# Patient Record
Sex: Male | Born: 1970
Health system: Southern US, Community
[De-identification: ages and names within clinical notes are randomized; demographics above are authoritative.]

## PROBLEM LIST (undated history)

## (undated) DIAGNOSIS — K7460 Unspecified cirrhosis of liver: Secondary | ICD-10-CM

## (undated) DIAGNOSIS — F419 Anxiety disorder, unspecified: Secondary | ICD-10-CM

## (undated) DIAGNOSIS — R188 Other ascites: Secondary | ICD-10-CM

## (undated) HISTORY — DX: Unspecified cirrhosis of liver: K74.60

## (undated) HISTORY — DX: Other ascites: R18.8

## (undated) HISTORY — PX: NO PAST SURGERIES: SHX2092

## (undated) HISTORY — PX: PARACENTESIS: SHX844

## (undated) HISTORY — PX: SMALL INTESTINE SURGERY: SHX150

---

## 2014-07-13 ENCOUNTER — Emergency Department (HOSPITAL_COMMUNITY): Payer: Self-pay

## 2014-07-13 ENCOUNTER — Encounter (HOSPITAL_COMMUNITY): Payer: Self-pay

## 2014-07-13 ENCOUNTER — Emergency Department (HOSPITAL_COMMUNITY)
Admission: EM | Admit: 2014-07-13 | Discharge: 2014-07-14 | Disposition: A | Discharge status: Home / Self Care | Payer: Self-pay | Attending: Emergency Medicine | Admitting: Emergency Medicine

## 2014-07-13 DIAGNOSIS — S92301A Fracture of unspecified metatarsal bone(s), right foot, initial encounter for closed fracture: Secondary | ICD-10-CM

## 2014-07-13 DIAGNOSIS — S80211A Abrasion, right knee, initial encounter: Secondary | ICD-10-CM | POA: Insufficient documentation

## 2014-07-13 DIAGNOSIS — Y9355 Activity, bike riding: Secondary | ICD-10-CM | POA: Insufficient documentation

## 2014-07-13 DIAGNOSIS — Y998 Other external cause status: Secondary | ICD-10-CM | POA: Insufficient documentation

## 2014-07-13 DIAGNOSIS — S93324A Dislocation of tarsometatarsal joint of right foot, initial encounter: Secondary | ICD-10-CM | POA: Insufficient documentation

## 2014-07-13 DIAGNOSIS — T07XXXA Unspecified multiple injuries, initial encounter: Secondary | ICD-10-CM

## 2014-07-13 DIAGNOSIS — S90512A Abrasion, left ankle, initial encounter: Secondary | ICD-10-CM | POA: Insufficient documentation

## 2014-07-13 DIAGNOSIS — Y92410 Unspecified street and highway as the place of occurrence of the external cause: Secondary | ICD-10-CM | POA: Insufficient documentation

## 2014-07-13 DIAGNOSIS — S52592A Other fractures of lower end of left radius, initial encounter for closed fracture: Secondary | ICD-10-CM | POA: Insufficient documentation

## 2014-07-13 DIAGNOSIS — S62102A Fracture of unspecified carpal bone, left wrist, initial encounter for closed fracture: Secondary | ICD-10-CM

## 2014-07-13 DIAGNOSIS — S92901A Unspecified fracture of right foot, initial encounter for closed fracture: Secondary | ICD-10-CM

## 2014-07-13 DIAGNOSIS — S93334A Other dislocation of right foot, initial encounter: Secondary | ICD-10-CM

## 2014-07-13 DIAGNOSIS — S92331A Displaced fracture of third metatarsal bone, right foot, initial encounter for closed fracture: Secondary | ICD-10-CM | POA: Insufficient documentation

## 2014-07-13 DIAGNOSIS — Z720 Tobacco use: Secondary | ICD-10-CM | POA: Insufficient documentation

## 2014-07-13 DIAGNOSIS — S80212A Abrasion, left knee, initial encounter: Secondary | ICD-10-CM | POA: Insufficient documentation

## 2014-07-13 MED ORDER — ONDANSETRON HCL 4 MG/2ML IJ SOLN
4.0000 mg | Freq: Once | INTRAMUSCULAR | Status: AC
  Administered 2014-07-13: 4 mg via INTRAVENOUS
  Filled 2014-07-13: qty 2

## 2014-07-13 MED ORDER — PROPOFOL 10 MG/ML IV BOLUS
INTRAVENOUS | Status: AC | PRN
  Administered 2014-07-13: 20 mg via INTRAVENOUS

## 2014-07-13 MED ORDER — PROPOFOL 10 MG/ML IV BOLUS
INTRAVENOUS | Status: AC | PRN
  Administered 2014-07-13: 40 mg via INTRAVENOUS

## 2014-07-13 MED ORDER — PROPOFOL 10 MG/ML IV BOLUS
INTRAVENOUS | Status: AC
  Filled 2014-07-13: qty 20

## 2014-07-13 MED ORDER — CEFAZOLIN SODIUM-DEXTROSE 2-3 GM-% IV SOLR
2.0000 g | Freq: Once | INTRAVENOUS | Status: AC
  Administered 2014-07-13: 2 g via INTRAVENOUS
  Filled 2014-07-13: qty 50

## 2014-07-13 MED ORDER — HYDROMORPHONE HCL 1 MG/ML IJ SOLN
1.0000 mg | Freq: Once | INTRAMUSCULAR | Status: AC
  Administered 2014-07-13: 1 mg via INTRAVENOUS
  Filled 2014-07-13: qty 1

## 2014-07-13 MED ORDER — PROPOFOL 10 MG/ML IV BOLUS
INTRAVENOUS | Status: AC | PRN
  Administered 2014-07-13: 50 mg via INTRAVENOUS

## 2014-07-13 MED ORDER — HYDROMORPHONE HCL 1 MG/ML IJ SOLN
1.0000 mg | Freq: Once | INTRAMUSCULAR | Status: AC
  Administered 2014-07-13: 0.6 mg via INTRAVENOUS
  Filled 2014-07-13: qty 1

## 2014-07-13 MED ORDER — PROPOFOL 10 MG/ML IV BOLUS
40.0000 mg | Freq: Once | INTRAVENOUS | Status: DC
  Filled 2014-07-13: qty 20

## 2014-07-13 MED ORDER — SODIUM CHLORIDE 0.9 % IV SOLN
INTRAVENOUS | Status: DC
  Administered 2014-07-13: 22:00:00 via INTRAVENOUS

## 2014-07-13 MED ORDER — TETANUS-DIPHTHERIA TOXOIDS TD 5-2 LFU IM INJ
0.5000 mL | INJECTION | Freq: Once | INTRAMUSCULAR | Status: AC
  Administered 2014-07-13: 0.5 mL via INTRAMUSCULAR
  Filled 2014-07-13: qty 0.5

## 2014-07-13 NOTE — Progress Notes (Signed)
Orthopedic Tech Progress Note Patient Details:  Mc Fecko March 14, 1970 [id number] Ordered by Dr. August Saucer orthopedic doctor. Patient ID: Landis Varon, male   DOB: March 28, 1970, 44 y.o.   MRN: [id number]   [name] 07/13/2014, 11:57 PM

## 2014-07-13 NOTE — ED Notes (Signed)
Pulses present in left wrist and right dorsalis pedis.
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
## 2014-07-13 NOTE — Progress Notes (Signed)
Orthopedic Tech Progress Note Patient Details:  Michael [name] Jun 25, 1970 [id number]  Ortho Devices Type of Ortho Device: Ace wrap, Volar splint, Post (short leg) splint, Stirrup splint Ortho Device/Splint Location: RLE short leg splint, LUE volar splint Ortho Device/Splint Interventions: Ordered, Application   [name] 07/13/2014, 11:56 PM

## 2014-07-13 NOTE — Progress Notes (Signed)
Orthopedic Tech Progress Note Patient Details:  Michael [name] 1970-06-14 [id number]  Ortho Devices Type of Ortho Device: Ace wrap, Volar splint, Post (short leg) splint, Stirrup splint Ortho Device/Splint Location: RLE short leg splint, LUE volar splint Ortho Device/Splint Interventions: Ordered, Application   [name] 07/13/2014, 11:57 PM
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
## 2014-07-13 NOTE — Progress Notes (Signed)
Orthopedic Tech Progress Note Patient Details:  Michael [name] [birth year] [id number]  Ortho Devices Type of Ortho Device: Ace wrap, Volar splint, Post (short leg) splint, Stirrup splint Ortho Device/Splint Location: RLE short leg splint, LUE volar splint Ortho Device/Splint Interventions: Ordered, Application   [name] 07/13/2014, 11:55 PM

## 2014-07-14 ENCOUNTER — Encounter (HOSPITAL_COMMUNITY): Payer: Self-pay

## 2014-07-14 ENCOUNTER — Emergency Department (HOSPITAL_COMMUNITY): Payer: Self-pay

## 2014-07-14 MED ORDER — IBUPROFEN 800 MG PO TABS: 800.0000 mg | ORAL_TABLET | Freq: Three times a day (TID) | ORAL | Status: DC

## 2014-07-14 MED ORDER — BACITRACIN ZINC 500 UNIT/GM EX OINT: 1.0000 "application " | TOPICAL_OINTMENT | Freq: Two times a day (BID) | CUTANEOUS | Status: DC

## 2014-07-14 MED ORDER — OXYCODONE-ACETAMINOPHEN 5-325 MG PO TABS: 1.0000 | ORAL_TABLET | ORAL | Status: DC | PRN

## 2014-07-14 MED ORDER — OXYCODONE-ACETAMINOPHEN 5-325 MG PO TABS
2.0000 | ORAL_TABLET | Freq: Once | ORAL | Status: AC
  Administered 2014-07-14: 2 via ORAL
  Filled 2014-07-14: qty 2

## 2014-07-14 NOTE — Discharge Instructions (Signed)
Non weight bearing through right foot and left wrist Elevate foot and wrist  Abrasion An abrasion is a cut or scrape of the skin. Abrasions do not extend through all layers of the skin and most heal within 10 days. It is important to care for your abrasion properly to prevent infection. CAUSES  Most abrasions are caused by falling on, or gliding across, the ground or other surface. When your skin rubs on something, the outer and inner layer of skin rubs off, causing an abrasion. DIAGNOSIS  Your caregiver will be able to diagnose an abrasion during a physical exam.  TREATMENT  Your treatment depends on how large and deep the abrasion is. Generally, your abrasion will be cleaned with water and a mild soap to remove any dirt or debris. An antibiotic ointment may be put over the abrasion to prevent an infection. A bandage (dressing) may be wrapped around the abrasion to keep it from getting dirty.  You may need a tetanus shot if:  You cannot remember when you had your last tetanus shot.  You have never had a tetanus shot.  The injury broke your skin. If you get a tetanus shot, your arm may swell, get red, and feel warm to the touch. This is common and not a problem. If you need a tetanus shot and you choose not to have one, there is a rare chance of getting tetanus. Sickness from tetanus can be serious.  HOME CARE INSTRUCTIONS   If a dressing was applied, change it at least once a day or as directed by your caregiver. If the bandage sticks, soak it off with warm water.   Wash the area with water and a mild soap to remove all the ointment 2 times a day. Rinse off the soap and pat the area dry with a clean towel.   Reapply any ointment as directed by your caregiver. This will help prevent infection and keep the bandage from sticking. Use gauze over the wound and under the dressing to help keep the bandage from sticking.   Change your dressing right away if it becomes wet or dirty.   Only  take over-the-counter or prescription medicines for pain, discomfort, or fever as directed by your caregiver.   Follow up with your caregiver within 24-48 hours for a wound check, or as directed. If you were not given a wound-check appointment, look closely at your abrasion for redness, swelling, or pus. These are signs of infection. SEEK IMMEDIATE MEDICAL CARE IF:   You have increasing pain in the wound.   You have redness, swelling, or tenderness around the wound.   You have pus coming from the wound.   You have a fever or persistent symptoms for more than 2-3 days.  You have a fever and your symptoms suddenly get worse.  You have a bad smell coming from the wound or dressing.  MAKE SURE YOU:   Understand these instructions.  Will watch your condition.  Will get help right away if you are not doing well or get worse. Document Released: 10/25/2004 Document Revised: 01/02/2012 Document Reviewed: 12/19/2010 [facility] Patient Information 2015 [place], [place]. This information is not intended to replace advice given to you by your health care provider. Make sure you discuss any questions you have with your health care provider. Metatarsal Fracture A metatarsal fracture is a break in the bone(s) of the foot. These are the bones of the foot that connect your toes to the bones of the ankle. DIAGNOSIS  The diagnoses of these fractures are usually made with X-rays. If there are problems in the forefoot and x-rays are normal a later bone scan will usually make the diagnosis.  TREATMENT AND HOME CARE INSTRUCTIONS  Treatment may or may not include a cast or walking shoe. When casts are needed the use is usually for short periods of time so as not to slow down healing with muscle wasting (atrophy).  Activities should be stopped until further advised by your caregiver.  Wear shoes with adequate shock absorbing capabilities and stiff soles.  Alternative exercise may be undertaken while  waiting for healing. These may include bicycling and swimming, or as your caregiver suggests.  It is important to keep all follow-up visits or specialty referrals. The failure to keep these appointments could result in improper bone healing and chronic pain or disability.  Warning: Do not drive a car or operate a motor vehicle until your caregiver specifically tells you it is safe to do so. IF YOU DO NOT HAVE A CAST OR SPLINT:  Apply ice to the injury for 15-20 minutes each hour while awake for the first 2 days. Put the ice in a plastic bag and place a towel between the bag of ice and your skin.  Only take over-the-counter or prescription medicines for pain, discomfort, or fever as directed by your caregiver. SEEK IMMEDIATE MEDICAL CARE IF:   Your cast gets damaged or breaks.  You have continued severe pain or more swelling than you did before the cast was put on, or the pain is not controlled with medications.  Your skin or nails below the injury turn blue or grey, or feel cold or numb.  There is a bad smell, or new stains or pus-like (purulent) drainage coming from the cast. MAKE SURE YOU:   Understand these instructions.  Will watch your condition.  Will get help right away if you are not doing well or get worse. Document Released: 10/07/2001 Document Revised: 04/09/2011 Document Reviewed: 08/29/2007 [facility] Patient Information 2015 [place], [place]. This information is not intended to replace advice given to you by your health care provider. Make sure you discuss any questions you have with your health care provider. Wrist Fracture A wrist fracture is a break or crack in one of the bones of your wrist. Your wrist is made up of eight small bones at the palm of your hand (carpal bones) and two long bones that make up your forearm (radius and ulna).  CAUSES   A direct blow to the wrist.  Falling on an outstretched hand.  Trauma, such as a car accident or a fall. RISK  FACTORS Risk factors for wrist fracture include:   Participating in contact and high-risk sports, such as skiing, biking, and ice skating.  Taking steroid medicines.  Smoking.  Being male.  Being Caucasian.  Drinking more than three alcoholic beverages per day.  Having low or lowered bone density (osteoporosis or osteopenia).  Age. Older adults have decreased bone density.  Women who have had menopause.  History of previous fractures. SIGNS AND SYMPTOMS Symptoms of wrist fractures include tenderness, bruising, and inflammation. Additionally, the wrist may hang in an odd position or appear deformed.  DIAGNOSIS Diagnosis may include:  Physical exam.  X-ray. TREATMENT Treatment depends on many factors, including the nature and location of the fracture, your age, and your activity level. Treatment for wrist fracture can be nonsurgical or surgical.  Nonsurgical Treatment A plaster cast or splint may be applied to your  wrist if the bone is in a good position. If the fracture is not in good position, it may be necessary for your health care provider to realign it before applying a splint or cast. Usually, a cast or splint will be worn for several weeks.  Surgical Treatment Sometimes the position of the bone is so far out of place that surgery is required to apply a device to hold it together as it heals. Depending on the fracture, there are a number of options for holding the bone in place while it heals, such as a cast and metal pins.  HOME CARE INSTRUCTIONS  Keep your injured wrist elevated and move your fingers as much as possible.  Do not put pressure on any part of your cast or splint. It may break.   Use a plastic bag to protect your cast or splint from water while bathing or showering. Do not lower your cast or splint into water.  Take medicines only as directed by your health care provider.  Keep your cast or splint clean and dry. If it becomes wet, damaged, or  suddenly feels too tight, contact your health care provider right away.  Do not use any tobacco products including cigarettes, chewing tobacco, or electronic cigarettes. Tobacco can delay bone healing. If you need help quitting, ask your health care provider.  Keep all follow-up visits as directed by your health care provider. This is important.  Ask your health care provider if you should take supplements of calcium and vitamins C and D to promote bone healing. SEEK MEDICAL CARE IF:   Your cast or splint is damaged, breaks, or gets wet.  You have a fever.  You have chills.  You have continued severe pain or more swelling than you did before the cast was put on. SEEK IMMEDIATE MEDICAL CARE IF:   Your hand or fingernails on the injured arm turn blue or gray, or feel cold or numb.  You have decreased feeling in the fingers of your injured arm. MAKE SURE YOU:  Understand these instructions.  Will watch your condition.  Will get help right away if you are not doing well or get worse. Document Released: 10/25/2004 Document Revised: 06/01/2013 Document Reviewed: 02/02/2011 [facility] Patient Information 2015 [place], [place]. This information is not intended to replace advice given to you by your health care provider. Make sure you discuss any questions you have with your health care provider.
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
## 2014-07-19 ENCOUNTER — Other Ambulatory Visit (HOSPITAL_COMMUNITY): Payer: Self-pay | Admitting: Orthopedic Surgery

## 2014-07-20 ENCOUNTER — Ambulatory Visit (HOSPITAL_COMMUNITY): Payer: Self-pay | Admitting: Certified Registered Nurse Anesthetist

## 2014-07-20 ENCOUNTER — Ambulatory Visit (HOSPITAL_COMMUNITY)
Admission: RE | Admit: 2014-07-20 | Discharge: 2014-07-20 | Disposition: A | Discharge status: Home / Self Care | Payer: Self-pay | Source: Ambulatory Visit | Attending: Orthopedic Surgery | Admitting: Orthopedic Surgery

## 2014-07-20 ENCOUNTER — Ambulatory Visit (HOSPITAL_COMMUNITY): Payer: Self-pay

## 2014-07-20 ENCOUNTER — Encounter (HOSPITAL_COMMUNITY)
Admission: RE | Disposition: A | Discharge status: Home / Self Care | Payer: Self-pay | Source: Ambulatory Visit | Attending: Orthopedic Surgery

## 2014-07-20 ENCOUNTER — Encounter (HOSPITAL_COMMUNITY): Payer: Self-pay | Admitting: *Deleted

## 2014-07-20 DIAGNOSIS — F17210 Nicotine dependence, cigarettes, uncomplicated: Secondary | ICD-10-CM | POA: Insufficient documentation

## 2014-07-20 DIAGNOSIS — Z419 Encounter for procedure for purposes other than remedying health state, unspecified: Secondary | ICD-10-CM

## 2014-07-20 DIAGNOSIS — S52572A Other intraarticular fracture of lower end of left radius, initial encounter for closed fracture: Secondary | ICD-10-CM | POA: Insufficient documentation

## 2014-07-20 HISTORY — DX: Anxiety disorder, unspecified: F41.9

## 2014-07-20 HISTORY — PX: ORIF WRIST FRACTURE: SHX2133

## 2014-07-20 LAB — CBC
HEMATOCRIT: 41 % (ref 39.0–52.0)
Hemoglobin: 14 g/dL (ref 13.0–17.0)
MCH: 34.1 pg — AB (ref 26.0–34.0)
MCHC: 34.1 g/dL (ref 30.0–36.0)
MCV: 99.8 fL (ref 78.0–100.0)
Platelets: 244 10*3/uL (ref 150–400)
RBC: 4.11 MIL/uL — ABNORMAL LOW (ref 4.22–5.81)
RDW: 13.6 % (ref 11.5–15.5)
WBC: 6.9 10*3/uL (ref 4.0–10.5)

## 2014-07-20 SURGERY — OPEN REDUCTION INTERNAL FIXATION (ORIF) WRIST FRACTURE
Anesthesia: General | Site: Wrist | Laterality: Left

## 2014-07-20 MED ORDER — ONDANSETRON HCL 4 MG/2ML IJ SOLN
INTRAMUSCULAR | Status: DC | PRN
Start: 2014-07-20 — End: 2014-07-20
  Administered 2014-07-20: 4 mg via INTRAVENOUS

## 2014-07-20 MED ORDER — LIDOCAINE HCL (CARDIAC) 20 MG/ML IV SOLN
INTRAVENOUS | Status: AC
  Filled 2014-07-20: qty 10

## 2014-07-20 MED ORDER — OXYCODONE HCL 5 MG PO TABS
ORAL_TABLET | ORAL | Status: AC
  Filled 2014-07-20: qty 1

## 2014-07-20 MED ORDER — ONDANSETRON HCL 4 MG/2ML IJ SOLN
INTRAMUSCULAR | Status: AC
  Filled 2014-07-20: qty 2

## 2014-07-20 MED ORDER — HYDROMORPHONE HCL 1 MG/ML IJ SOLN
INTRAMUSCULAR | Status: AC
  Filled 2014-07-20: qty 1

## 2014-07-20 MED ORDER — OXYCODONE HCL 5 MG PO TABS: 5.0000 mg | ORAL_TABLET | Freq: Once | ORAL | Status: DC | PRN

## 2014-07-20 MED ORDER — OXYCODONE HCL 5 MG/5ML PO SOLN: 5.0000 mg | Freq: Once | ORAL | Status: AC | PRN

## 2014-07-20 MED ORDER — LIDOCAINE HCL 4 % MT SOLN
OROMUCOSAL | Status: DC | PRN
  Administered 2014-07-20: 4 mL via TOPICAL

## 2014-07-20 MED ORDER — MIDAZOLAM HCL 2 MG/2ML IJ SOLN
INTRAMUSCULAR | Status: AC
  Filled 2014-07-20: qty 2

## 2014-07-20 MED ORDER — MEPERIDINE HCL 25 MG/ML IJ SOLN: 6.2500 mg | INTRAMUSCULAR | Status: DC | PRN

## 2014-07-20 MED ORDER — BUPIVACAINE HCL (PF) 0.25 % IJ SOLN
INTRAMUSCULAR | Status: AC
  Filled 2014-07-20: qty 30

## 2014-07-20 MED ORDER — LIDOCAINE HCL (CARDIAC) 20 MG/ML IV SOLN
INTRAVENOUS | Status: DC | PRN
  Administered 2014-07-20: 100 mg via INTRAVENOUS

## 2014-07-20 MED ORDER — HYDROMORPHONE HCL 1 MG/ML IJ SOLN: 0.2500 mg | INTRAMUSCULAR | Status: DC | PRN

## 2014-07-20 MED ORDER — ROCURONIUM BROMIDE 50 MG/5ML IV SOLN
INTRAVENOUS | Status: AC
  Filled 2014-07-20: qty 1

## 2014-07-20 MED ORDER — LIDOCAINE HCL (CARDIAC) 20 MG/ML IV SOLN
INTRAVENOUS | Status: AC
  Filled 2014-07-20: qty 5

## 2014-07-20 MED ORDER — PROPOFOL 10 MG/ML IV BOLUS
INTRAVENOUS | Status: DC | PRN
  Administered 2014-07-20: 200 mg via INTRAVENOUS

## 2014-07-20 MED ORDER — EPINEPHRINE HCL 1 MG/ML IJ SOLN
INTRAMUSCULAR | Status: DC | PRN
  Administered 2014-07-20: 1 mg

## 2014-07-20 MED ORDER — ARTIFICIAL TEARS OP OINT
TOPICAL_OINTMENT | OPHTHALMIC | Status: AC
  Filled 2014-07-20: qty 3.5

## 2014-07-20 MED ORDER — FENTANYL CITRATE (PF) 250 MCG/5ML IJ SOLN
INTRAMUSCULAR | Status: AC
  Filled 2014-07-20: qty 5

## 2014-07-20 MED ORDER — EPHEDRINE SULFATE 50 MG/ML IJ SOLN
INTRAMUSCULAR | Status: DC | PRN
  Administered 2014-07-20 (×2): 10 mg via INTRAVENOUS

## 2014-07-20 MED ORDER — EPINEPHRINE HCL 1 MG/ML IJ SOLN
INTRAMUSCULAR | Status: AC
  Filled 2014-07-20: qty 1

## 2014-07-20 MED ORDER — OXYCODONE HCL 5 MG PO TABS
5.0000 mg | ORAL_TABLET | Freq: Once | ORAL | Status: AC | PRN
  Administered 2014-07-20: 5 mg via ORAL

## 2014-07-20 MED ORDER — CEFAZOLIN SODIUM-DEXTROSE 2-3 GM-% IV SOLR
INTRAVENOUS | Status: AC
  Administered 2014-07-20: 2 g via INTRAVENOUS
  Filled 2014-07-20: qty 50

## 2014-07-20 MED ORDER — 0.9 % SODIUM CHLORIDE (POUR BTL) OPTIME
TOPICAL | Status: DC | PRN
  Administered 2014-07-20 (×2): 1000 mL

## 2014-07-20 MED ORDER — NEOSTIGMINE METHYLSULFATE 10 MG/10ML IV SOLN
INTRAVENOUS | Status: AC
  Filled 2014-07-20: qty 1

## 2014-07-20 MED ORDER — PROPOFOL 10 MG/ML IV BOLUS
INTRAVENOUS | Status: AC
  Filled 2014-07-20: qty 20

## 2014-07-20 MED ORDER — ARTIFICIAL TEARS OP OINT
TOPICAL_OINTMENT | OPHTHALMIC | Status: DC | PRN
  Administered 2014-07-20: 1 via OPHTHALMIC

## 2014-07-20 MED ORDER — SUCCINYLCHOLINE CHLORIDE 20 MG/ML IJ SOLN
INTRAMUSCULAR | Status: DC | PRN
  Administered 2014-07-20: 100 mg via INTRAVENOUS

## 2014-07-20 MED ORDER — OXYCODONE HCL 5 MG/5ML PO SOLN: 5.0000 mg | Freq: Once | ORAL | Status: DC | PRN

## 2014-07-20 MED ORDER — FENTANYL CITRATE (PF) 100 MCG/2ML IJ SOLN
INTRAMUSCULAR | Status: DC | PRN
  Administered 2014-07-20 (×2): 50 ug via INTRAVENOUS
  Administered 2014-07-20: 100 ug via INTRAVENOUS
  Administered 2014-07-20: 50 ug via INTRAVENOUS

## 2014-07-20 MED ORDER — HYDROMORPHONE HCL 1 MG/ML IJ SOLN
0.2500 mg | INTRAMUSCULAR | Status: DC | PRN
  Administered 2014-07-20 (×3): 0.5 mg via INTRAVENOUS

## 2014-07-20 MED ORDER — BUPIVACAINE HCL (PF) 0.25 % IJ SOLN
INTRAMUSCULAR | Status: DC | PRN
  Administered 2014-07-20: 15 mL

## 2014-07-20 MED ORDER — GLYCOPYRROLATE 0.2 MG/ML IJ SOLN
INTRAMUSCULAR | Status: AC
  Filled 2014-07-20: qty 3

## 2014-07-20 MED ORDER — MIDAZOLAM HCL 5 MG/5ML IJ SOLN
INTRAMUSCULAR | Status: DC | PRN
  Administered 2014-07-20: 2 mg via INTRAVENOUS

## 2014-07-20 MED ORDER — LACTATED RINGERS IV SOLN
INTRAVENOUS | Status: DC
  Administered 2014-07-20: 50 mL/h via INTRAVENOUS

## 2014-07-20 SURGICAL SUPPLY — 80 items
BANDAGE ELASTIC 4 VELCRO ST LF (GAUZE/BANDAGES/DRESSINGS) ×3 IMPLANT
BENZOIN TINCTURE PRP APPL 2/3 (GAUZE/BANDAGES/DRESSINGS) ×3 IMPLANT
BIT DRILL 2.2 SS TIBIAL (BIT) ×3 IMPLANT
BLADE SURG 10 STRL SS (BLADE) ×3 IMPLANT
BNDG COHESIVE 4X5 TAN STRL (GAUZE/BANDAGES/DRESSINGS) ×3 IMPLANT
BNDG ESMARK 4X9 LF (GAUZE/BANDAGES/DRESSINGS) ×3 IMPLANT
BNDG GAUZE ELAST 4 BULKY (GAUZE/BANDAGES/DRESSINGS) IMPLANT
CLOSURE WOUND 1/2 X4 (GAUZE/BANDAGES/DRESSINGS) ×1
CORDS BIPOLAR (ELECTRODE) ×3 IMPLANT
COVER SURGICAL LIGHT HANDLE (MISCELLANEOUS) ×3 IMPLANT
CUFF TOURNIQUET SINGLE 18IN (TOURNIQUET CUFF) ×3 IMPLANT
CUFF TOURNIQUET SINGLE 24IN (TOURNIQUET CUFF) IMPLANT
DRAIN TLS ROUND 10FR (DRAIN) IMPLANT
DRAPE C-ARM 42X72 X-RAY (DRAPES) ×3 IMPLANT
DRAPE INCISE IOBAN 66X45 STRL (DRAPES) ×3 IMPLANT
DRAPE OEC MINIVIEW 54X84 (DRAPES) IMPLANT
DRAPE U-SHAPE 47X51 STRL (DRAPES) ×3 IMPLANT
DRSG PAD ABDOMINAL 8X10 ST (GAUZE/BANDAGES/DRESSINGS) ×3 IMPLANT
DRSG TEGADERM 2-3/8X2-3/4 SM (GAUZE/BANDAGES/DRESSINGS) ×3 IMPLANT
DURAPREP 26ML APPLICATOR (WOUND CARE) ×3 IMPLANT
ELECT REM PT RETURN 9FT ADLT (ELECTROSURGICAL) ×3
ELECTRODE REM PT RTRN 9FT ADLT (ELECTROSURGICAL) ×1 IMPLANT
FACESHIELD WRAPAROUND (MASK) ×3 IMPLANT
GAUZE SPONGE 4X4 12PLY STRL (GAUZE/BANDAGES/DRESSINGS) IMPLANT
GAUZE XEROFORM 1X8 LF (GAUZE/BANDAGES/DRESSINGS) IMPLANT
GLOVE BIO SURGEON ST LM GN SZ9 (GLOVE) ×3 IMPLANT
GLOVE BIOGEL PI IND STRL 6.5 (GLOVE) ×2 IMPLANT
GLOVE BIOGEL PI IND STRL 7.0 (GLOVE) ×1 IMPLANT
GLOVE BIOGEL PI IND STRL 8 (GLOVE) ×1 IMPLANT
GLOVE BIOGEL PI INDICATOR 6.5 (GLOVE) ×4
GLOVE BIOGEL PI INDICATOR 7.0 (GLOVE) ×2
GLOVE BIOGEL PI INDICATOR 8 (GLOVE) ×2
GLOVE ECLIPSE 6.5 STRL STRAW (GLOVE) ×3 IMPLANT
GLOVE SURG ORTHO 8.0 STRL STRW (GLOVE) ×3 IMPLANT
GLOVE SURG SS PI 6.5 STRL IVOR (GLOVE) ×3 IMPLANT
GLOVE SURG SS PI 7.0 STRL IVOR (GLOVE) ×3 IMPLANT
GOWN STRL REUS W/ TWL LRG LVL3 (GOWN DISPOSABLE) ×3 IMPLANT
GOWN STRL REUS W/ TWL XL LVL3 (GOWN DISPOSABLE) ×1 IMPLANT
GOWN STRL REUS W/TWL LRG LVL3 (GOWN DISPOSABLE) ×6
GOWN STRL REUS W/TWL XL LVL3 (GOWN DISPOSABLE) ×2
K-WIRE 1.6 (WIRE) ×4
K-WIRE FX5X1.6XNS BN SS (WIRE) ×2
KIT BASIN OR (CUSTOM PROCEDURE TRAY) ×3 IMPLANT
KIT ROOM TURNOVER OR (KITS) ×3 IMPLANT
KWIRE FX5X1.6XNS BN SS (WIRE) ×2 IMPLANT
MANIFOLD NEPTUNE II (INSTRUMENTS) ×3 IMPLANT
NEEDLE 22X1 1/2 (OR ONLY) (NEEDLE) ×3 IMPLANT
NS IRRIG 1000ML POUR BTL (IV SOLUTION) ×3 IMPLANT
PACK ORTHO EXTREMITY (CUSTOM PROCEDURE TRAY) ×3 IMPLANT
PAD ARMBOARD 7.5X6 YLW CONV (MISCELLANEOUS) ×6 IMPLANT
PAD CAST 3X4 CTTN HI CHSV (CAST SUPPLIES) ×1 IMPLANT
PAD CAST 4YDX4 CTTN HI CHSV (CAST SUPPLIES) ×2 IMPLANT
PADDING CAST COTTON 3X4 STRL (CAST SUPPLIES) ×2
PADDING CAST COTTON 4X4 STRL (CAST SUPPLIES) ×4
PEG LOCKING SMOOTH 2.2X18 (Peg) ×12 IMPLANT
PEG LOCKING SMOOTH 2.2X22 (Screw) ×9 IMPLANT
PLATE STANDARD DVR LEFT (Plate) ×3 IMPLANT
PLATE STD DVR LT 24X51 (Plate) ×1 IMPLANT
SCREW LOCK 14X2.7X 3 LD TPR (Screw) ×2 IMPLANT
SCREW LOCKING 2.7X14 (Screw) ×4 IMPLANT
SCREW LOCKING 2.7X15MM (Screw) ×3 IMPLANT
SPLINT FIBERGLASS 4X30 (CAST SUPPLIES) ×3 IMPLANT
SPONGE GAUZE 4X4 12PLY STER LF (GAUZE/BANDAGES/DRESSINGS) ×6 IMPLANT
SPONGE LAP 4X18 X RAY DECT (DISPOSABLE) ×6 IMPLANT
STAPLER VISISTAT 35W (STAPLE) IMPLANT
STOCKINETTE IMPERVIOUS 9X36 MD (GAUZE/BANDAGES/DRESSINGS) ×3 IMPLANT
STRIP CLOSURE SKIN 1/2X4 (GAUZE/BANDAGES/DRESSINGS) ×2 IMPLANT
SUCTION FRAZIER TIP 10 FR DISP (SUCTIONS) ×3 IMPLANT
SUT ETHILON 3 0 PS 1 (SUTURE) ×6 IMPLANT
SUT PROLENE 3 0 PS 1 (SUTURE) IMPLANT
SUT VIC AB 2-0 CTB1 (SUTURE) IMPLANT
SUT VIC AB 3-0 X1 27 (SUTURE) ×3 IMPLANT
SYR CONTROL 10ML LL (SYRINGE) ×3 IMPLANT
SYSTEM CHEST DRAIN TLS 7FR (DRAIN) IMPLANT
TOWEL OR 17X24 6PK STRL BLUE (TOWEL DISPOSABLE) ×3 IMPLANT
TOWEL OR 17X26 10 PK STRL BLUE (TOWEL DISPOSABLE) ×3 IMPLANT
TUBE CONNECTING 12'X1/4 (SUCTIONS) ×1
TUBE CONNECTING 12X1/4 (SUCTIONS) ×2 IMPLANT
WATER STERILE IRR 1000ML POUR (IV SOLUTION) ×3 IMPLANT
YANKAUER SUCT BULB TIP NO VENT (SUCTIONS) IMPLANT

## 2014-07-20 NOTE — Progress Notes (Signed)
MD present at bedside along with pts mother.  MD OK for pt to go home.  Discharge instructions discussed with pt along with warning signs to watch for.

## 2014-07-20 NOTE — Transfer of Care (Signed)
Immediate Anesthesia Transfer of Care Note  Patient: Michael [name]  Procedure(s) Performed: Procedure(s): OPEN REDUCTION INTERNAL FIXATION (ORIF) WRIST FRACTURE (Left)  Patient Location: PACU  Anesthesia Type:General  Level of Consciousness: awake, alert  and oriented  Airway & Oxygen Therapy: Patient Spontanous Breathing and Patient connected to nasal cannula oxygen  Post-op Assessment: Report given to RN and Post -op Vital signs reviewed and stable  Post vital signs: Reviewed and stable  Last Vitals:  Filed Vitals:   07/20/14 1949  BP:   Pulse: 105  Temp: 36.4 C  Resp: 12    Complications: No apparent anesthesia complications
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
## 2014-07-20 NOTE — Anesthesia Procedure Notes (Signed)
Procedure Name: Intubation Date/Time: 07/20/2014 5:57 PM Performed by: Willeen Cass P Pre-anesthesia Checklist: Patient identified, Timeout performed, Emergency Drugs available, Suction available and Patient being monitored Patient Re-evaluated:Patient Re-evaluated prior to inductionOxygen Delivery Method: Circle system utilized Preoxygenation: Pre-oxygenation with 100% oxygen Intubation Type: IV induction Ventilation: Mask ventilation without difficulty Laryngoscope Size: Mac and 3 Grade View: Grade I Tube type: Oral Tube size: 7.5 mm Number of attempts: 1 Airway Equipment and Method: Stylet and LTA kit utilized Placement Confirmation: ETT inserted through vocal cords under direct vision,  breath sounds checked- equal and bilateral and positive ETCO2 Secured at: 23 cm Tube secured with: Tape Dental Injury: Teeth and Oropharynx as per pre-operative assessment

## 2014-07-20 NOTE — Anesthesia Preprocedure Evaluation (Addendum)
Anesthesia Evaluation  Patient identified by MRN, date of birth, ID band  Reviewed: Allergy & Precautions, NPO status , Patient's Chart, lab work & pertinent test results  History of Anesthesia Complications Negative for: history of anesthetic complications  Airway Mallampati: II  TM Distance: >3 FB Neck ROM: Full    Dental  (+) Teeth Intact, Dental Advisory Given   Pulmonary Current Smoker,    Pulmonary exam normal       Cardiovascular negative cardio ROS Normal cardiovascular examRhythm:Regular     Neuro/Psych    GI/Hepatic negative GI ROS, Neg liver ROS,   Endo/Other  negative endocrine ROS  Renal/GU negative Renal ROS     Musculoskeletal   Abdominal   Peds negative pediatric ROS (+)  Hematology   Anesthesia Other Findings   Reproductive/Obstetrics                           Anesthesia Physical Anesthesia Plan  ASA: II  Anesthesia Plan: General   Post-op Pain Management:    Induction: Intravenous  Airway Management Planned: Oral ETT  Additional Equipment:   Intra-op Plan:   Post-operative Plan: Extubation in OR  Informed Consent: I have reviewed the patients History and Physical, chart, labs and discussed the procedure including the risks, benefits and alternatives for the proposed anesthesia with the patient or authorized representative who has indicated his/her understanding and acceptance.   Dental advisory given  Plan Discussed with: CRNA, Anesthesiologist and Surgeon  Anesthesia Plan Comments:         Anesthesia Quick Evaluation

## 2014-07-20 NOTE — Anesthesia Postprocedure Evaluation (Signed)
  Anesthesia Post-op Note  Patient: Michael [name]  Procedure(s) Performed: Procedure(s): OPEN REDUCTION INTERNAL FIXATION (ORIF) WRIST FRACTURE (Left)  Patient Location: PACU  Anesthesia Type: No value filed.   Level of Consciousness: awake, alert  and oriented  Airway and Oxygen Therapy: Patient Spontanous Breathing  Post-op Pain: mild  Post-op Assessment: Post-op Vital signs reviewed  Post-op Vital Signs: Reviewed  Last Vitals:  Filed Vitals:   07/20/14 2045  BP: 164/105  Pulse: 101  Temp:   Resp: 20    Complications: No apparent anesthesia complications

## 2014-07-20 NOTE — Progress Notes (Signed)
MD phoned in to check on pt.  Progress of pts fingers remains the same.  Fingers bluish/purpleish.  Cap refill sluggish.  MD wants dressing loosened, and to place oxygen saturation probe on the affected fingers.  Oxygen saturations equal on both hands.  MD will be in in 10 minutes to take a look at patient.
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
## 2014-07-20 NOTE — Brief Op Note (Signed)
07/20/2014  7:41 PM  PATIENT:  Para March  44 y.o. male  PRE-OPERATIVE DIAGNOSIS:  LEFT WRIST FRACTURE  POST-OPERATIVE DIAGNOSIS:  LEFT WRIST FRACTURE intraarticular 3 part  PROCEDURE:  Procedure(s): OPEN REDUCTION INTERNAL FIXATION (ORIF) WRIST FRACTURE  SURGEON:  Surgeon(s): Cammy Copa, MD  ASSISTANT: none  ANESTHESIA:   general  EBL: 23 ml       BLOOD ADMINISTERED: none  DRAINS: none   LOCAL MEDICATIONS USED:  none  SPECIMEN:  No Specimen  COUNTS:  YES  TOURNIQUET:   Total Tourniquet Time Documented: Upper Arm (Left) - 44 minutes Total: Upper Arm (Left) - 44 minutes   DICTATION: .Other Dictation: Dictation Number [id number]  PLAN OF CARE: Discharge to home after PACU  PATIENT DISPOSITION:  PACU - hemodynamically stable

## 2014-07-20 NOTE — Progress Notes (Signed)
Orthopedic Tech Progress Note Patient Details:  Michael [name] March 30, 1970 [id number]  Ortho Devices Type of Ortho Device: Arm sling Ortho Device/Splint Location: LUE Ortho Device/Splint Interventions: Ordered, Application   [name] 07/20/2014, 9:00 PM

## 2014-07-21 ENCOUNTER — Encounter (HOSPITAL_COMMUNITY): Payer: Self-pay | Admitting: Orthopedic Surgery

## 2014-07-21 NOTE — Op Note (Signed)
NAME:  [name], [name]                      ACCOUNT NO.:  MEDICAL RECORD NO.:  [id number]  LOCATION:                                 FACILITY:  PHYSICIAN:  Burnard Bunting, M.D.    DATE OF BIRTH:  [birth year]  DATE OF PROCEDURE: DATE OF DISCHARGE:                              OPERATIVE REPORT   PREOPERATIVE DIAGNOSIS:  Left wrist intra-articular three-part distal radius fracture.  POSTOPERATIVE DIAGNOSIS:  Left wrist intra-articular three-part distal radius fracture.  PROCEDURE:  Open reduction and internal fixation of left distal radius fracture.  SURGEON:  Burnard Bunting, MD  ASSISTANT:  None.  ANESTHESIA:  General.  TOURNIQUET TIME:  44 minutes at 250 mmHg.  INDICATIONS:  Michael [name] is a patient with foot fracture and ankle fracture, presents for operative management after explanation of risks and benefits.  PROCEDURE IN DETAIL:  Patient was brought to the operating room where general anesthetic was induced.  Preoperative antibiotics administered. Time-out was called.  Left arm was prescrubbed with alcohol and Betadine, and allowed to air dry, prepped with DuraPrep solution and draped in sterile manner.  An incision was made along the course of the FCR tendon, 8 cm out to the proximal flexion, wrist flexion crease. Skin and subcutaneous tissue were sharply divided.  Sheath of the FCR tendon was incised, mobilized radially with the radial artery and veins. Median nerve identified, protected all times during the case.  Pronator quadratus incised along its radial border and removed with the periosteal elevator off the radius, distal radius and the distal radius fracture site.  Fracture site was visualized.  Manual reduction was performed.  The fragments aligned nicely.  Plate was applied from [employer].  Good reduction confirmed the AP and lateral planes.  Screws placed distally and proximally after K-wire fixation confirmed adequate trajectory and clearance.  Smooth peg was  utilized distally locking, proximal nonlocking cortical screw was used.  The patient had excellent wrist range of motion, good pronation, supination.  At this time, thorough irrigation was performed.  Instruments were removed. Tourniquet was released.  Bleeding points were encountered and controlled using bipolar electrocautery.  Skin closed loosely using 3-0 Vicryl and 3-0 nylon suture.  Well-padded volar splint applied.  The patient tolerated the procedure well without immediate complication, transferred to recovery room in stable condition.     Burnard Bunting, M.D.     GSD/MEDQ  D:  07/20/2014  T:  07/21/2014  Job:  [id number]

## 2014-08-03 ENCOUNTER — Other Ambulatory Visit (HOSPITAL_COMMUNITY): Payer: Self-pay | Admitting: Orthopedic Surgery

## 2014-08-05 ENCOUNTER — Encounter (HOSPITAL_COMMUNITY): Payer: Self-pay | Admitting: *Deleted

## 2014-08-06 ENCOUNTER — Encounter (HOSPITAL_COMMUNITY): Payer: Self-pay | Admitting: *Deleted

## 2014-08-06 ENCOUNTER — Ambulatory Visit (HOSPITAL_COMMUNITY): Payer: Self-pay | Admitting: Certified Registered Nurse Anesthetist

## 2014-08-06 ENCOUNTER — Encounter (HOSPITAL_COMMUNITY)
Admission: RE | Disposition: A | Discharge status: Home / Self Care | Payer: Self-pay | Source: Ambulatory Visit | Attending: Orthopedic Surgery

## 2014-08-06 ENCOUNTER — Ambulatory Visit (HOSPITAL_COMMUNITY)
Admission: RE | Admit: 2014-08-06 | Discharge: 2014-08-06 | Disposition: A | Discharge status: Home / Self Care | Payer: Self-pay | Source: Ambulatory Visit | Attending: Orthopedic Surgery | Admitting: Orthopedic Surgery

## 2014-08-06 DIAGNOSIS — Y929 Unspecified place or not applicable: Secondary | ICD-10-CM | POA: Insufficient documentation

## 2014-08-06 DIAGNOSIS — S92331A Displaced fracture of third metatarsal bone, right foot, initial encounter for closed fracture: Secondary | ICD-10-CM | POA: Insufficient documentation

## 2014-08-06 DIAGNOSIS — X58XXXA Exposure to other specified factors, initial encounter: Secondary | ICD-10-CM | POA: Insufficient documentation

## 2014-08-06 DIAGNOSIS — S92321A Displaced fracture of second metatarsal bone, right foot, initial encounter for closed fracture: Secondary | ICD-10-CM | POA: Insufficient documentation

## 2014-08-06 DIAGNOSIS — Y999 Unspecified external cause status: Secondary | ICD-10-CM | POA: Insufficient documentation

## 2014-08-06 DIAGNOSIS — F17290 Nicotine dependence, other tobacco product, uncomplicated: Secondary | ICD-10-CM | POA: Insufficient documentation

## 2014-08-06 DIAGNOSIS — S93324S Dislocation of tarsometatarsal joint of right foot, sequela: Secondary | ICD-10-CM

## 2014-08-06 DIAGNOSIS — S92311A Displaced fracture of first metatarsal bone, right foot, initial encounter for closed fracture: Secondary | ICD-10-CM | POA: Insufficient documentation

## 2014-08-06 DIAGNOSIS — Y939 Activity, unspecified: Secondary | ICD-10-CM | POA: Insufficient documentation

## 2014-08-06 HISTORY — PX: OPEN REDUCTION INTERNAL FIXATION (ORIF) FOOT LISFRANC FRACTURE: SHX5990

## 2014-08-06 LAB — COMPREHENSIVE METABOLIC PANEL
ALT: 26 U/L (ref 17–63)
AST: 40 U/L (ref 15–41)
Albumin: 3.6 g/dL (ref 3.5–5.0)
Alkaline Phosphatase: 98 U/L (ref 38–126)
Anion gap: 7 (ref 5–15)
BUN: <5 mg/dL — ABNORMAL LOW (ref 6–20)
CALCIUM: 8.9 mg/dL (ref 8.9–10.3)
CO2: 26 mmol/L (ref 22–32)
CREATININE: 0.99 mg/dL (ref 0.61–1.24)
Chloride: 104 mmol/L (ref 101–111)
GFR calc non Af Amer: >60 mL/min (ref >60)
Glucose, Bld: 96 mg/dL (ref 65–99)
Potassium: 4.2 mmol/L (ref 3.5–5.1)
SODIUM: 137 mmol/L (ref 135–145)
TOTAL PROTEIN: 6.7 g/dL (ref 6.5–8.1)
Total Bilirubin: 0.4 mg/dL (ref 0.3–1.2)

## 2014-08-06 LAB — CBC
HCT: 46.7 % (ref 39.0–52.0)
Hemoglobin: 15.6 g/dL (ref 13.0–17.0)
MCH: 33.5 pg (ref 26.0–34.0)
MCHC: 33.4 g/dL (ref 30.0–36.0)
MCV: 100.4 fL — ABNORMAL HIGH (ref 78.0–100.0)
PLATELETS: 331 10*3/uL (ref 150–400)
RBC: 4.65 MIL/uL (ref 4.22–5.81)
RDW: 13.3 % (ref 11.5–15.5)
WBC: 6.2 10*3/uL (ref 4.0–10.5)

## 2014-08-06 LAB — PROTIME-INR
INR: 1.06 (ref 0.00–1.49)
Prothrombin Time: 14 seconds (ref 11.6–15.2)

## 2014-08-06 LAB — APTT: aPTT: 29 seconds (ref 24–37)

## 2014-08-06 SURGERY — OPEN REDUCTION INTERNAL FIXATION (ORIF) FOOT LISFRANC FRACTURE
Anesthesia: General | Site: Foot | Laterality: Right

## 2014-08-06 MED ORDER — METHOCARBAMOL 500 MG PO TABS
ORAL_TABLET | ORAL | Status: AC
  Administered 2014-08-06: 500 mg
  Filled 2014-08-06: qty 2

## 2014-08-06 MED ORDER — ONDANSETRON HCL 4 MG/2ML IJ SOLN
INTRAMUSCULAR | Status: DC | PRN
  Administered 2014-08-06: 4 mg via INTRAVENOUS

## 2014-08-06 MED ORDER — ONDANSETRON HCL 4 MG/2ML IJ SOLN
4.0000 mg | Freq: Once | INTRAMUSCULAR | Status: AC | PRN
  Administered 2014-08-06: 4 mg via INTRAVENOUS

## 2014-08-06 MED ORDER — MIDAZOLAM HCL 5 MG/5ML IJ SOLN: INTRAMUSCULAR | Status: DC | PRN

## 2014-08-06 MED ORDER — ONDANSETRON HCL 4 MG/2ML IJ SOLN
INTRAMUSCULAR | Status: AC
  Administered 2014-08-06: 4 mg via INTRAVENOUS
  Filled 2014-08-06: qty 2

## 2014-08-06 MED ORDER — HYDROMORPHONE HCL 1 MG/ML IJ SOLN
INTRAMUSCULAR | Status: AC
  Administered 2014-08-06: 0.5 mg via INTRAVENOUS
  Filled 2014-08-06: qty 1

## 2014-08-06 MED ORDER — PROPOFOL 10 MG/ML IV BOLUS
INTRAVENOUS | Status: DC | PRN
  Administered 2014-08-06: 200 mg via INTRAVENOUS

## 2014-08-06 MED ORDER — FENTANYL CITRATE (PF) 100 MCG/2ML IJ SOLN
INTRAMUSCULAR | Status: AC
  Administered 2014-08-06: 50 ug via INTRAVENOUS
  Filled 2014-08-06: qty 2

## 2014-08-06 MED ORDER — GLYCOPYRROLATE 0.2 MG/ML IJ SOLN
INTRAMUSCULAR | Status: DC | PRN
  Administered 2014-08-06: 0.2 mg via INTRAVENOUS

## 2014-08-06 MED ORDER — FENTANYL CITRATE (PF) 100 MCG/2ML IJ SOLN
INTRAMUSCULAR | Status: DC | PRN
  Administered 2014-08-06: 50 ug via INTRAVENOUS
  Administered 2014-08-06: 100 ug via INTRAVENOUS
  Administered 2014-08-06: 50 ug via INTRAVENOUS
  Administered 2014-08-06: 100 ug via INTRAVENOUS
  Administered 2014-08-06 (×2): 50 ug via INTRAVENOUS

## 2014-08-06 MED ORDER — FENTANYL CITRATE (PF) 100 MCG/2ML IJ SOLN
25.0000 ug | INTRAMUSCULAR | Status: DC | PRN
  Administered 2014-08-06 (×2): 50 ug via INTRAVENOUS

## 2014-08-06 MED ORDER — MIDAZOLAM HCL 2 MG/2ML IJ SOLN
INTRAMUSCULAR | Status: AC
Start: 2014-08-06 — End: 2014-08-06
  Filled 2014-08-06: qty 2

## 2014-08-06 MED ORDER — CEFAZOLIN SODIUM-DEXTROSE 2-3 GM-% IV SOLR
2.0000 g | INTRAVENOUS | Status: AC
  Administered 2014-08-06: 2 g via INTRAVENOUS

## 2014-08-06 MED ORDER — DEXAMETHASONE SODIUM PHOSPHATE 4 MG/ML IJ SOLN
INTRAMUSCULAR | Status: DC | PRN
  Administered 2014-08-06: 8 mg via INTRAVENOUS

## 2014-08-06 MED ORDER — LIDOCAINE HCL (CARDIAC) 20 MG/ML IV SOLN
INTRAVENOUS | Status: DC | PRN
  Administered 2014-08-06: 50 mg via INTRAVENOUS

## 2014-08-06 MED ORDER — HYDRALAZINE HCL 20 MG/ML IJ SOLN
5.0000 mg | INTRAMUSCULAR | Status: DC | PRN
  Administered 2014-08-06 (×3): 5 mg via INTRAVENOUS

## 2014-08-06 MED ORDER — LACTATED RINGERS IV SOLN
INTRAVENOUS | Status: DC
  Administered 2014-08-06 (×3): via INTRAVENOUS

## 2014-08-06 MED ORDER — OXYCODONE-ACETAMINOPHEN 5-325 MG PO TABS
ORAL_TABLET | ORAL | Status: AC
  Administered 2014-08-06: 2
  Filled 2014-08-06: qty 2

## 2014-08-06 MED ORDER — OXYCODONE-ACETAMINOPHEN 5-325 MG PO TABS: 1.0000 | ORAL_TABLET | ORAL | Status: DC | PRN

## 2014-08-06 MED ORDER — PROPOFOL 10 MG/ML IV BOLUS
INTRAVENOUS | Status: AC
  Filled 2014-08-06: qty 20

## 2014-08-06 MED ORDER — 0.9 % SODIUM CHLORIDE (POUR BTL) OPTIME
TOPICAL | Status: DC | PRN
  Administered 2014-08-06: 1000 mL

## 2014-08-06 MED ORDER — HYDROMORPHONE HCL 1 MG/ML IJ SOLN
0.2500 mg | INTRAMUSCULAR | Status: DC | PRN
  Administered 2014-08-06 (×4): 0.5 mg via INTRAVENOUS

## 2014-08-06 MED ORDER — MIDAZOLAM HCL 5 MG/5ML IJ SOLN
INTRAMUSCULAR | Status: DC | PRN
  Administered 2014-08-06: 2 mg via INTRAVENOUS

## 2014-08-06 MED ORDER — FENTANYL CITRATE (PF) 250 MCG/5ML IJ SOLN
INTRAMUSCULAR | Status: AC
  Filled 2014-08-06: qty 5

## 2014-08-06 MED ORDER — CEFAZOLIN SODIUM-DEXTROSE 2-3 GM-% IV SOLR
INTRAVENOUS | Status: AC
  Filled 2014-08-06: qty 50

## 2014-08-06 MED ORDER — FENTANYL CITRATE (PF) 250 MCG/5ML IJ SOLN
INTRAMUSCULAR | Status: AC
Start: 2014-08-06 — End: 2014-08-06
  Filled 2014-08-06: qty 5

## 2014-08-06 MED ORDER — HYDRALAZINE HCL 20 MG/ML IJ SOLN
INTRAMUSCULAR | Status: AC
  Administered 2014-08-06: 5 mg via INTRAVENOUS
  Filled 2014-08-06: qty 1

## 2014-08-06 MED ORDER — ASPIRIN EC 325 MG PO TBEC: 325.0000 mg | DELAYED_RELEASE_TABLET | Freq: Every day | ORAL | Status: DC

## 2014-08-06 SURGICAL SUPPLY — 52 items
BANDAGE ESMARK 6X9 LF (GAUZE/BANDAGES/DRESSINGS) IMPLANT
BIT DRILL 110MM 85MM (BIT) ×1 IMPLANT
BIT DRILL 2.5 X LONG (BIT) ×1
BIT DRILL X LONG 2.5 (BIT) ×1 IMPLANT
BNDG COHESIVE 4X5 TAN STRL (GAUZE/BANDAGES/DRESSINGS) ×3 IMPLANT
BNDG ESMARK 6X9 LF (GAUZE/BANDAGES/DRESSINGS)
BNDG GAUZE ELAST 4 BULKY (GAUZE/BANDAGES/DRESSINGS) ×3 IMPLANT
CANISTER SUCTION 2500CC (MISCELLANEOUS) ×3 IMPLANT
COVER SURGICAL LIGHT HANDLE (MISCELLANEOUS) ×6 IMPLANT
CUFF TOURNIQUET SINGLE 34IN LL (TOURNIQUET CUFF) IMPLANT
CUFF TOURNIQUET SINGLE 44IN (TOURNIQUET CUFF) IMPLANT
DRAPE INCISE IOBAN 66X45 STRL (DRAPES) ×3 IMPLANT
DRAPE OEC MINIVIEW 54X84 (DRAPES) ×3 IMPLANT
DRAPE PROXIMA HALF (DRAPES) ×3 IMPLANT
DRAPE U-SHAPE 47X51 STRL (DRAPES) ×3 IMPLANT
DRILL BIT 110MM/85MM (BIT) ×2
DRILL BIT X LONG 2.5 (BIT) ×2
DRSG ADAPTIC 3X8 NADH LF (GAUZE/BANDAGES/DRESSINGS) ×3 IMPLANT
DRSG MEPILEX BORDER 4X8 (GAUZE/BANDAGES/DRESSINGS) ×3 IMPLANT
DRSG PAD ABDOMINAL 8X10 ST (GAUZE/BANDAGES/DRESSINGS) ×6 IMPLANT
DURAPREP 26ML APPLICATOR (WOUND CARE) ×3 IMPLANT
ELECT REM PT RETURN 9FT ADLT (ELECTROSURGICAL) ×3
ELECTRODE REM PT RTRN 9FT ADLT (ELECTROSURGICAL) ×1 IMPLANT
GAUZE SPONGE 4X4 12PLY STRL (GAUZE/BANDAGES/DRESSINGS) ×3 IMPLANT
GLOVE BIOGEL PI IND STRL 9 (GLOVE) ×1 IMPLANT
GLOVE BIOGEL PI INDICATOR 9 (GLOVE) ×2
GLOVE SURG ORTHO 9.0 STRL STRW (GLOVE) ×3 IMPLANT
GOWN STRL REUS W/ TWL XL LVL3 (GOWN DISPOSABLE) ×3 IMPLANT
GOWN STRL REUS W/TWL XL LVL3 (GOWN DISPOSABLE) ×6
GUIDEWIRE THREADED 1.6 (WIRE) ×3 IMPLANT
KIT BASIN OR (CUSTOM PROCEDURE TRAY) ×3 IMPLANT
KIT ROOM TURNOVER OR (KITS) ×3 IMPLANT
MANIFOLD NEPTUNE II (INSTRUMENTS) IMPLANT
NS IRRIG 1000ML POUR BTL (IV SOLUTION) ×3 IMPLANT
PACK ORTHO EXTREMITY (CUSTOM PROCEDURE TRAY) ×3 IMPLANT
PAD ARMBOARD 7.5X6 YLW CONV (MISCELLANEOUS) ×6 IMPLANT
PLATE LC DCP 2.0 6H (Plate) ×3 IMPLANT
SCREW CANN 38MM FULLY (Screw) ×3 IMPLANT
SCREW CORTEX 3.5 40MM (Screw) ×2 IMPLANT
SCREW CORTEX ST 2.0X12 (Screw) ×3 IMPLANT
SCREW CORTEX ST 2.0X16 (Screw) ×6 IMPLANT
SCREW LOCK CORT ST 3.5X40 (Screw) ×1 IMPLANT
SPONGE LAP 18X18 X RAY DECT (DISPOSABLE) ×3 IMPLANT
STAPLER VISISTAT 35W (STAPLE) IMPLANT
SUCTION FRAZIER TIP 10 FR DISP (SUCTIONS) ×3 IMPLANT
SUT ETHILON 2 0 FS 18 (SUTURE) ×6 IMPLANT
SUT ETHILON 2 0 PSLX (SUTURE) IMPLANT
SUT VIC AB 2-0 CTB1 (SUTURE) IMPLANT
TOWEL OR 17X24 6PK STRL BLUE (TOWEL DISPOSABLE) ×3 IMPLANT
TOWEL OR 17X26 10 PK STRL BLUE (TOWEL DISPOSABLE) ×3 IMPLANT
TUBE CONNECTING 12'X1/4 (SUCTIONS) ×1
TUBE CONNECTING 12X1/4 (SUCTIONS) ×2 IMPLANT

## 2014-08-06 NOTE — Transfer of Care (Signed)
Immediate Anesthesia Transfer of Care Note  Patient: Michael [name]  Procedure(s) Performed: Procedure(s): RIGHT FOOT FRACTURE AND LISFRANC LIGAMENT INJURY FIXATION AND SUTURE REMOVAL LEFT WRIST INCISION (Right)  Patient Location: PACU  Anesthesia Type:General  Level of Consciousness: awake, alert , oriented, patient cooperative and responds to stimulation  Airway & Oxygen Therapy: Patient Spontanous Breathing and Patient connected to nasal cannula oxygen  Post-op Assessment: Report given to RN, Post -op Vital signs reviewed and stable, Patient moving all extremities X 4 and Patient able to stick tongue midline  Post vital signs: stable  Last Vitals:  Filed Vitals:   08/06/14 1405  BP: 166/104  Pulse: 87  Resp: 16    Complications: No apparent anesthesia complications

## 2014-08-06 NOTE — Op Note (Signed)
08/06/2014  1:39 PM  PATIENT:  [name]    PRE-OPERATIVE DIAGNOSIS:  Lisfranc fracture dislocation right foot with fractures of the second and third metatarsal shafts  POST-OPERATIVE DIAGNOSIS:  Same  PROCEDURE:  Open reduction internal fixation of the Lisfranc joint including the base of the first metatarsal medial cuneiform and the Lisfranc ligament #2 (reduction internal fixation second metatarsal shaft.  SURGEON:  Nadara MustardUDA,Emelina Hinch V, MD  PHYSICIAN ASSISTANT:None ANESTHESIA:   General  PREOPERATIVE INDICATIONS:  Michael MarchWilliam Shawler is a  44 y.o. male with a diagnosis of right foot fracture and Lisfranc ligament injury who failed conservative measures and elected for surgical management.    The risks benefits and alternatives were discussed with the patient preoperatively including but not limited to the risks of infection, bleeding, nerve injury, cardiopulmonary complications, the need for revision surgery, among others, and the patient was willing to proceed.  OPERATIVE IMPLANTS: Small frag screws and mini frag plate and screws  OPERATIVE FINDINGS: Bony destruction through the Lisfranc ligamentous complex  OPERATIVE PROCEDURE: Patient is a 44 year old gentleman who sustained multiple trauma including multiple fractures to the Lisfranc joint and metatarsals of the right foot. Patient had multiple abrasions and surgery was delayed until these abrasions had healed. Risks and benefits were discussed including potential for increased infection due to these skin soft tissue injury. Patient states she understands wishes to proceed at this time.  Patient was brought to the operating room and I will underwent a general and aesthetic. Patient did not want a regional block. After adequate levels of anesthesia were obtained patient's right lower extremity was prepped using DuraPrep draped into a sterile field. A timeout was called. A dorsal incision was made over the second metatarsal. Blunt dissection was  carried down to the base of the first metatarsal medial cuneiform. The joint was subluxed with bony fragments bony fragments were removed the joint was reduced and stabilized with a 3.5 cortical screw. C-arm fluoroscopy verified alignment. Attention was then focused on the Lisfranc complex a screw was placed from medial cuneiform across the base of the second metatarsal to stabilize the Lisfranc ligament. A 2.0 mini frag plate was then used dorsally as a buttress plate to stabilize the second metatarsal. C-arm fluoroscopy verified alignment the wound was irrigated with normal saline incision was closed using 2-0 nylon. The wound was covered with sterile compressive dressing. The sutures from the left wrist were also removed after the dressing was applied. Patient was extubated taken to the PACU in stable condition.

## 2014-08-06 NOTE — H&P (Addendum)
Para [name] is an 44 y.o. male.   Chief Complaint: Unstable Lisfranc fracture dislocation right foot HPI: Patient is a 44 year old gentleman who sustained a closed Lisfranc fracture dislocation. Patient has waited for the open wounds to resolve and presents at this time for internal fixation.  Past Medical History  Diagnosis Date  . Anxiety     Past Surgical History  Procedure Laterality Date  . No past surgeries      SURGERY AS INFANT PYLORIC STENOSIS  . Orif wrist fracture Left 07/20/2014    Procedure: OPEN REDUCTION INTERNAL FIXATION (ORIF) WRIST FRACTURE;  Surgeon: Cammy CopaScott Gregory Dean, MD;  Location: [facility] OR;  Service: Orthopedics;  Laterality: Left;  . Small intestine surgery      as an infant    History reviewed. No pertinent family history. Social History:  reports that he has been smoking.  His smokeless tobacco use includes Snuff. He reports that he drinks about 1.2 oz of alcohol per week. He reports that he does not use illicit drugs.  Allergies: No Known Allergies  No prescriptions prior to admission    No results found for this or any previous visit (from the past 48 hour(s)). No results found.  Review of Systems  All other systems reviewed and are negative.   There were no vitals taken for this visit. Physical Exam  Objective examination the wounds are healing quite nicely. Radiograph shows displaced fracture with a displaced fracture through the Lisfranc joint as well as through the second metatarsal. Assessment/Plan Assessment: Unstable right Lisfranc fracture dislocation.  Plan: We'll plan for open reduction internal fixation. Discussed the increased risk of infection due to the abrasions.  Michael [name] V 08/06/2014, 7:14 AM

## 2014-08-06 NOTE — Anesthesia Postprocedure Evaluation (Signed)
  Anesthesia Post-op Note  Patient: Michael [name]  Procedure(s) Performed: Procedure(s) (LRB): RIGHT FOOT FRACTURE AND LISFRANC LIGAMENT INJURY FIXATION AND SUTURE REMOVAL LEFT WRIST INCISION (Right)  Patient Location: PACU  Anesthesia Type: General  Level of Consciousness: awake and alert   Airway and Oxygen Therapy: Patient Spontanous Breathing  Post-op Pain: mild  Post-op Assessment: Post-op Vital signs reviewed, Patient's Cardiovascular Status Stable, Respiratory Function Stable, Patent Airway and No signs of Nausea or vomiting  Last Vitals:  Filed Vitals:   08/06/14 1405  BP: 166/104  Pulse: 87  Temp: 37 C  Resp: 16    Post-op Vital Signs: stable   Complications: No apparent anesthesia complications

## 2014-08-06 NOTE — Anesthesia Preprocedure Evaluation (Signed)
Anesthesia Evaluation  Patient identified by MRN, date of birth, ID band Patient awake    Reviewed: Allergy & Precautions, NPO status , Patient's Chart, lab work & pertinent test results  History of Anesthesia Complications Negative for: history of anesthetic complications  Airway Mallampati: II  TM Distance: >3 FB Neck ROM: Full    Dental no notable dental hx. (+) Dental Advisory Given   Pulmonary Current Smoker,  breath sounds clear to auscultation  Pulmonary exam normal       Cardiovascular negative cardio ROS Normal cardiovascular examRhythm:Regular Rate:Normal     Neuro/Psych PSYCHIATRIC DISORDERS Anxiety negative neurological ROS     GI/Hepatic negative GI ROS, Neg liver ROS,   Endo/Other  negative endocrine ROS  Renal/GU negative Renal ROS  negative genitourinary   Musculoskeletal negative musculoskeletal ROS (+)   Abdominal   Peds negative pediatric ROS (+)  Hematology negative hematology ROS (+)   Anesthesia Other Findings   Reproductive/Obstetrics negative OB ROS                             Anesthesia Physical Anesthesia Plan  ASA: II  Anesthesia Plan: General   Post-op Pain Management:    Induction: Intravenous  Airway Management Planned: LMA  Additional Equipment:   Intra-op Plan:   Post-operative Plan: Extubation in OR  Informed Consent: I have reviewed the patients History and Physical, chart, labs and discussed the procedure including the risks, benefits and alternatives for the proposed anesthesia with the patient or authorized representative who has indicated his/her understanding and acceptance.   Dental advisory given  Plan Discussed with: CRNA  Anesthesia Plan Comments:         Anesthesia Quick Evaluation

## 2014-08-06 NOTE — Anesthesia Procedure Notes (Signed)
Procedure Name: LMA Insertion Date/Time: 08/06/2014 12:48 PM Performed by: Marylyn IshiharaFURLOW, Chenell Lozon Pre-anesthesia Checklist: Patient identified, Timeout performed, Emergency Drugs available, Suction available and Patient being monitored Patient Re-evaluated:Patient Re-evaluated prior to inductionOxygen Delivery Method: Circle system utilized Preoxygenation: Pre-oxygenation with 100% oxygen Intubation Type: IV induction Ventilation: Mask ventilation without difficulty LMA: LMA inserted LMA Size: 4.0 Number of attempts: 1 Placement Confirmation: positive ETCO2 and breath sounds checked- equal and bilateral Tube secured with: Tape Dental Injury: Teeth and Oropharynx as per pre-operative assessment

## 2014-08-09 ENCOUNTER — Encounter (HOSPITAL_COMMUNITY): Payer: Self-pay | Admitting: Orthopedic Surgery

## 2015-06-17 ENCOUNTER — Inpatient Hospital Stay (HOSPITAL_COMMUNITY)
Admission: EM | Admit: 2015-06-17 | Discharge: 2015-06-23 | DRG: 433 | Disposition: A | Discharge status: Home Health | Payer: Self-pay | Attending: Internal Medicine | Admitting: Internal Medicine

## 2015-06-17 ENCOUNTER — Emergency Department (HOSPITAL_COMMUNITY): Payer: Self-pay

## 2015-06-17 ENCOUNTER — Encounter (HOSPITAL_COMMUNITY): Payer: Self-pay

## 2015-06-17 DIAGNOSIS — K648 Other hemorrhoids: Secondary | ICD-10-CM | POA: Diagnosis present

## 2015-06-17 DIAGNOSIS — K767 Hepatorenal syndrome: Secondary | ICD-10-CM | POA: Diagnosis present

## 2015-06-17 DIAGNOSIS — E538 Deficiency of other specified B group vitamins: Secondary | ICD-10-CM | POA: Diagnosis present

## 2015-06-17 DIAGNOSIS — K7290 Hepatic failure, unspecified without coma: Secondary | ICD-10-CM | POA: Diagnosis present

## 2015-06-17 DIAGNOSIS — K7031 Alcoholic cirrhosis of liver with ascites: Principal | ICD-10-CM

## 2015-06-17 DIAGNOSIS — D72829 Elevated white blood cell count, unspecified: Secondary | ICD-10-CM | POA: Diagnosis present

## 2015-06-17 DIAGNOSIS — K7011 Alcoholic hepatitis with ascites: Secondary | ICD-10-CM | POA: Diagnosis present

## 2015-06-17 DIAGNOSIS — E871 Hypo-osmolality and hyponatremia: Secondary | ICD-10-CM | POA: Diagnosis present

## 2015-06-17 DIAGNOSIS — E8770 Fluid overload, unspecified: Secondary | ICD-10-CM | POA: Diagnosis present

## 2015-06-17 DIAGNOSIS — Z7982 Long term (current) use of aspirin: Secondary | ICD-10-CM

## 2015-06-17 DIAGNOSIS — R188 Other ascites: Secondary | ICD-10-CM | POA: Diagnosis present

## 2015-06-17 DIAGNOSIS — D539 Nutritional anemia, unspecified: Secondary | ICD-10-CM | POA: Diagnosis present

## 2015-06-17 DIAGNOSIS — K7010 Alcoholic hepatitis without ascites: Secondary | ICD-10-CM | POA: Diagnosis present

## 2015-06-17 DIAGNOSIS — T380X5A Adverse effect of glucocorticoids and synthetic analogues, initial encounter: Secondary | ICD-10-CM | POA: Diagnosis present

## 2015-06-17 DIAGNOSIS — F419 Anxiety disorder, unspecified: Secondary | ICD-10-CM | POA: Diagnosis present

## 2015-06-17 DIAGNOSIS — E876 Hypokalemia: Secondary | ICD-10-CM | POA: Diagnosis present

## 2015-06-17 DIAGNOSIS — F17200 Nicotine dependence, unspecified, uncomplicated: Secondary | ICD-10-CM | POA: Diagnosis present

## 2015-06-17 DIAGNOSIS — Z79899 Other long term (current) drug therapy: Secondary | ICD-10-CM

## 2015-06-17 DIAGNOSIS — D689 Coagulation defect, unspecified: Secondary | ICD-10-CM | POA: Diagnosis present

## 2015-06-17 DIAGNOSIS — K7040 Alcoholic hepatic failure without coma: Secondary | ICD-10-CM | POA: Diagnosis present

## 2015-06-17 DIAGNOSIS — N179 Acute kidney failure, unspecified: Secondary | ICD-10-CM | POA: Diagnosis present

## 2015-06-17 LAB — COMPREHENSIVE METABOLIC PANEL
ALK PHOS: 237 U/L — AB (ref 38–126)
ALT: 58 U/L (ref 17–63)
ANION GAP: 12 (ref 5–15)
AST: 222 U/L — ABNORMAL HIGH (ref 15–41)
Albumin: 2 g/dL — ABNORMAL LOW (ref 3.5–5.0)
BILIRUBIN TOTAL: 23.7 mg/dL — AB (ref 0.3–1.2)
BUN: 22 mg/dL — ABNORMAL HIGH (ref 6–20)
CALCIUM: 8.2 mg/dL — AB (ref 8.9–10.3)
CO2: 23 mmol/L (ref 22–32)
CREATININE: 1.43 mg/dL — AB (ref 0.61–1.24)
Chloride: 88 mmol/L — ABNORMAL LOW (ref 101–111)
GFR calc non Af Amer: 57 mL/min — ABNORMAL LOW (ref >60)
GLUCOSE: 104 mg/dL — AB (ref 65–99)
Potassium: 3.3 mmol/L — ABNORMAL LOW (ref 3.5–5.1)
SODIUM: 123 mmol/L — AB (ref 135–145)
TOTAL PROTEIN: 6.8 g/dL (ref 6.5–8.1)

## 2015-06-17 LAB — CBC
HCT: 34.5 % — ABNORMAL LOW (ref 39.0–52.0)
HEMOGLOBIN: 12.1 g/dL — AB (ref 13.0–17.0)
MCH: 35.6 pg — AB (ref 26.0–34.0)
MCHC: 35.1 g/dL (ref 30.0–36.0)
MCV: 101.5 fL — AB (ref 78.0–100.0)
Platelets: 277 10*3/uL (ref 150–400)
RBC: 3.4 MIL/uL — AB (ref 4.22–5.81)
RDW: 13.9 % (ref 11.5–15.5)
WBC: 17.6 10*3/uL — ABNORMAL HIGH (ref 4.0–10.5)

## 2015-06-17 LAB — ETHANOL: Alcohol, Ethyl (B): <5 mg/dL (ref <5)

## 2015-06-17 LAB — PROTIME-INR
INR: 1.73 — AB (ref 0.00–1.49)
PROTHROMBIN TIME: 20.2 s — AB (ref 11.6–15.2)

## 2015-06-17 LAB — URINALYSIS, ROUTINE W REFLEX MICROSCOPIC
GLUCOSE, UA: NEGATIVE mg/dL
HGB URINE DIPSTICK: NEGATIVE
Ketones, ur: 15 mg/dL — AB
Nitrite: POSITIVE — AB
Protein, ur: NEGATIVE mg/dL
Specific Gravity, Urine: 1.018 (ref 1.005–1.030)
pH: 5.5 (ref 5.0–8.0)

## 2015-06-17 LAB — BILIRUBIN, FRACTIONATED(TOT/DIR/INDIR)
Bilirubin, Direct: 14.4 mg/dL — ABNORMAL HIGH (ref 0.1–0.5)
Indirect Bilirubin: 7.5 mg/dL — ABNORMAL HIGH (ref 0.3–0.9)
Total Bilirubin: 21.9 mg/dL (ref 0.3–1.2)

## 2015-06-17 LAB — AMMONIA: Ammonia: 55 umol/L — ABNORMAL HIGH (ref 9–35)

## 2015-06-17 LAB — URINE MICROSCOPIC-ADD ON: RBC / HPF: NONE SEEN RBC/hpf (ref 0–5)

## 2015-06-17 LAB — LIPASE, BLOOD: Lipase: 69 U/L — ABNORMAL HIGH (ref 11–51)

## 2015-06-17 MED ORDER — LORAZEPAM 1 MG PO TABS
1.0000 mg | ORAL_TABLET | Freq: Four times a day (QID) | ORAL | Status: AC | PRN
  Administered 2015-06-18 – 2015-06-20 (×8): 1 mg via ORAL
  Filled 2015-06-17 (×7): qty 1

## 2015-06-17 MED ORDER — LORAZEPAM 1 MG PO TABS
0.0000 mg | ORAL_TABLET | Freq: Two times a day (BID) | ORAL | Status: AC
  Administered 2015-06-21 (×2): 1 mg via ORAL
  Filled 2015-06-17 (×2): qty 1

## 2015-06-17 MED ORDER — VITAMIN K1 10 MG/ML IJ SOLN
5.0000 mg | Freq: Once | INTRAVENOUS | Status: AC
  Administered 2015-06-18: 5 mg via INTRAVENOUS
  Filled 2015-06-17: qty 0.5

## 2015-06-17 MED ORDER — LORAZEPAM 1 MG PO TABS
0.0000 mg | ORAL_TABLET | Freq: Four times a day (QID) | ORAL | Status: AC
  Administered 2015-06-18: 1 mg via ORAL
  Filled 2015-06-17 (×2): qty 1

## 2015-06-17 MED ORDER — ONDANSETRON HCL 4 MG PO TABS: 4.0000 mg | ORAL_TABLET | Freq: Four times a day (QID) | ORAL | Status: DC | PRN

## 2015-06-17 MED ORDER — DEXTROSE 5 % IV SOLN
2.0000 g | INTRAVENOUS | Status: DC
  Administered 2015-06-18 – 2015-06-23 (×6): 2 g via INTRAVENOUS
  Filled 2015-06-17 (×6): qty 2

## 2015-06-17 MED ORDER — LORAZEPAM 2 MG/ML IJ SOLN: 1.0000 mg | Freq: Four times a day (QID) | INTRAMUSCULAR | Status: AC | PRN

## 2015-06-17 MED ORDER — FOLIC ACID 1 MG PO TABS
1.0000 mg | ORAL_TABLET | Freq: Every day | ORAL | Status: DC
Start: 2015-06-18 — End: 2015-06-23
  Administered 2015-06-18 – 2015-06-23 (×6): 1 mg via ORAL
  Filled 2015-06-17 (×6): qty 1

## 2015-06-17 MED ORDER — ADULT MULTIVITAMIN W/MINERALS CH
1.0000 | ORAL_TABLET | Freq: Every day | ORAL | Status: DC
  Administered 2015-06-18 – 2015-06-23 (×6): 1 via ORAL
  Filled 2015-06-17 (×6): qty 1

## 2015-06-17 MED ORDER — THIAMINE HCL 100 MG/ML IJ SOLN
100.0000 mg | Freq: Every day | INTRAMUSCULAR | Status: DC
  Filled 2015-06-17: qty 2

## 2015-06-17 MED ORDER — ONDANSETRON HCL 4 MG/2ML IJ SOLN: 4.0000 mg | Freq: Four times a day (QID) | INTRAMUSCULAR | Status: DC | PRN

## 2015-06-17 MED ORDER — SODIUM CHLORIDE 0.9% FLUSH
3.0000 mL | Freq: Two times a day (BID) | INTRAVENOUS | Status: DC
  Administered 2015-06-18 – 2015-06-23 (×11): 3 mL via INTRAVENOUS

## 2015-06-17 MED ORDER — VITAMIN B-1 100 MG PO TABS
100.0000 mg | ORAL_TABLET | Freq: Every day | ORAL | Status: DC
  Administered 2015-06-18 – 2015-06-23 (×6): 100 mg via ORAL
  Filled 2015-06-17 (×7): qty 1

## 2015-06-17 NOTE — Consult Note (Signed)
Reason for Consult:gallbladder wall thickening.   Referring Physician: Dr. Len Blalock [name] is an 44 y.o. male.  HPI:  Patient is 45 year old male who presents with back pain, abdominal distention, and jaundice. He has been feeling a little weak and has been complaining of back pain. However today his family noticed his eyes turned yellow and  Despite normally being very skinny, his abdomen had distended significantly. He denies any significant abdominal pain. He has not had anything to drink now for 5-6 days.  He normally drinks 2 very large servings of alcohol every day. He denies history of bloody vomit or black tarry stools. He denies history of confusion.  Past Medical History  Diagnosis Date  . Anxiety     Past Surgical History  Procedure Laterality Date  . No past surgeries      SURGERY AS INFANT PYLORIC STENOSIS  . Orif wrist fracture Left 07/20/2014    Procedure: OPEN REDUCTION INTERNAL FIXATION (ORIF) WRIST FRACTURE;  Surgeon: Michael Pel, MD;  Location: [name];  Service: Orthopedics;  Laterality: Left;  . Small intestine surgery      as an infant  . Open reduction internal fixation (orif) foot lisfranc fracture Right 08/06/2014    Procedure: RIGHT FOOT FRACTURE AND LISFRANC LIGAMENT INJURY FIXATION AND SUTURE REMOVAL LEFT WRIST INCISION;  Surgeon: Michael Minion, MD;  Location: [name];  Service: Orthopedics;  Laterality: Right;    History reviewed. No pertinent family history.  Social History:  reports that he has been smoking.  His smokeless tobacco use includes Snuff. He reports that he drinks about 1.2 oz of alcohol per week. He reports that he does not use illicit drugs.  Allergies: No Known Allergies  Medications: mucinex miralax ASA Ibuprofen oxycodone  Results for orders placed or performed during the hospital encounter of 06/17/15 (from the past 48 hour(s))  Lipase, blood     Status: Abnormal   Collection Time: 06/17/15  3:40 PM  Result Value Ref Range    Lipase 69 (H) 11 - 51 U/L  Comprehensive metabolic panel     Status: Abnormal   Collection Time: 06/17/15  3:40 PM  Result Value Ref Range   Sodium 123 (L) 135 - 145 mmol/L   Potassium 3.3 (L) 3.5 - 5.1 mmol/L   Chloride 88 (L) 101 - 111 mmol/L   CO2 23 22 - 32 mmol/L   Glucose, Bld 104 (H) 65 - 99 mg/dL   BUN 22 (H) 6 - 20 mg/dL   Creatinine, Ser 1.43 (H) 0.61 - 1.24 mg/dL   Calcium 8.2 (L) 8.9 - 10.3 mg/dL   Total Protein 6.8 6.5 - 8.1 g/dL   Albumin 2.0 (L) 3.5 - 5.0 g/dL   AST 222 (H) 15 - 41 U/L   ALT 58 17 - 63 U/L   Alkaline Phosphatase 237 (H) 38 - 126 U/L   Total Bilirubin 23.7 (HH) 0.3 - 1.2 mg/dL    Comment: CRITICAL RESULT CALLED TO, READ BACK BY AND VERIFIED WITH: K PATE,RN 1644 06/17/15 D BRADLEY    GFR calc non Af Amer 57 (L) >60 mL/min   GFR calc Af Amer >60 >60 mL/min    Comment: (NOTE) The eGFR has been calculated using the CKD EPI equation. This calculation has not been validated in all clinical situations. eGFR's persistently <60 mL/min signify possible Chronic Kidney Disease.    Anion gap 12 5 - 15  CBC     Status: Abnormal   Collection Time: 06/17/15  3:40 PM  Result Value Ref Range   WBC 17.6 (H) 4.0 - 10.5 K/uL   RBC 3.40 (L) 4.22 - 5.81 MIL/uL   Hemoglobin 12.1 (L) 13.0 - 17.0 g/dL   HCT 34.5 (L) 39.0 - 52.0 %   MCV 101.5 (H) 78.0 - 100.0 fL   MCH 35.6 (H) 26.0 - 34.0 pg   MCHC 35.1 30.0 - 36.0 g/dL   RDW 13.9 11.5 - 15.5 %   Platelets 277 150 - 400 K/uL  Urinalysis, Routine w reflex microscopic     Status: Abnormal   Collection Time: 06/17/15  3:45 PM  Result Value Ref Range   Color, Urine AMBER (A) YELLOW    Comment: BIOCHEMICALS MAY BE AFFECTED BY COLOR   APPearance CLOUDY (A) CLEAR   Specific Gravity, Urine 1.018 1.005 - 1.030   pH 5.5 5.0 - 8.0   Glucose, UA NEGATIVE NEGATIVE mg/dL   Hgb urine dipstick NEGATIVE NEGATIVE   Bilirubin Urine LARGE (A) NEGATIVE   Ketones, ur 15 (A) NEGATIVE mg/dL   Protein, ur NEGATIVE NEGATIVE mg/dL    Nitrite POSITIVE (A) NEGATIVE   Leukocytes, UA SMALL (A) NEGATIVE  Urine microscopic-add on     Status: Abnormal   Collection Time: 06/17/15  3:45 PM  Result Value Ref Range   Squamous Epithelial / LPF 0-5 (A) NONE SEEN   WBC, UA 0-5 0 - 5 WBC/hpf   RBC / HPF NONE SEEN 0 - 5 RBC/hpf   Bacteria, UA FEW (A) NONE SEEN   Casts HYALINE CASTS (A) NEGATIVE    Comment: GRANULAR CAST WBC CAST EPITHELIAL CASTS    Urine-Other MUCOUS PRESENT     Comment: AMORPHOUS URATES/PHOSPHATES  Protime-INR     Status: Abnormal   Collection Time: 06/17/15  9:26 PM  Result Value Ref Range   Prothrombin Time 20.2 (H) 11.6 - 15.2 seconds   INR 1.73 (H) 0.00 - 1.49  Ethanol     Status: None   Collection Time: 06/17/15  9:26 PM  Result Value Ref Range   Alcohol, Ethyl (B) <5 <5 mg/dL    Comment:        LOWEST DETECTABLE LIMIT FOR SERUM ALCOHOL IS 5 mg/dL FOR MEDICAL PURPOSES ONLY   Ammonia     Status: Abnormal   Collection Time: 06/17/15  9:26 PM  Result Value Ref Range   Ammonia 55 (H) 9 - 35 umol/L  Bilirubin, fractionated(tot/dir/indir)     Status: Abnormal   Collection Time: 06/17/15  9:26 PM  Result Value Ref Range   Total Bilirubin 21.9 (HH) 0.3 - 1.2 mg/dL    Comment: CRITICAL RESULT CALLED TO, READ BACK BY AND VERIFIED WITH: FIELDS,K RN 06/17/2015 2249 JORDANS    Bilirubin, Direct 14.4 (H) 0.1 - 0.5 mg/dL    Comment: RESULTS CONFIRMED BY MANUAL DILUTION   Indirect Bilirubin 7.5 (H) 0.3 - 0.9 mg/dL    Dg Chest 2 View  06/17/2015  CLINICAL DATA:  Shortness of breath EXAM: CHEST  2 VIEW COMPARISON:  07/13/2014 chest radiograph. FINDINGS: Stable cardiomediastinal silhouette with normal heart size. No pneumothorax. No pleural effusion. Minimal scarring versus atelectasis at the anterior left lung base. No pulmonary edema. No acute consolidative airspace disease. IMPRESSION: Minimal scarring versus atelectasis at the anterior left lung base. Otherwise no active disease in the chest. Electronically  Signed   By: Michael [name] M.D.   On: 06/17/2015 19:29   US Abdomen Complete  06/17/2015  CLINICAL DATA:  Abdominal pain and distention.  Alcohol abuse. EXAM: ABDOMEN ULTRASOUND COMPLETE COMPARISON:  None. FINDINGS: Gallbladder: Nondistended gallbladder contains several tiny echogenic non shadowing foci in the dependent gallbladder measuring up to 5 mm in size, probably sludge balls or tiny non shadowing gallstones. Mild diffuse gallbladder wall thickening. No pericholecystic fluid or sonographic Murphy sign. Common bile duct: Diameter: 4 mm Liver: Liver parenchyma is diffusely coarsened and echogenic, there is posterior acoustic attenuation in the liver and the liver surface appears slightly irregular, suggesting cirrhosis in superimposed steatosis. No liver mass detected, noting decreased sensitivity in the setting of an echogenic liver. IVC: No abnormality visualized. Pancreas: Poorly visualized due to overlying bowel gas. Spleen: Size and appearance within normal limits more (craniocaudal splenic length 12.1 cm). Right Kidney: Length: 11.4 cm. Echogenicity within normal limits. No mass or hydronephrosis visualized. Left Kidney: Length: 10.3 cm. Echogenicity within normal limits. No mass or hydronephrosis visualized. Abdominal aorta: No aneurysm visualized. Other findings: Moderate volume ascites. IMPRESSION: 1. Sonographic findings are suggestive of cirrhosis and superimposed hepatic steatosis. No liver mass detected. 2. Moderate volume ascites.  Top-normal size spleen. 3. Tiny sludge balls versus non shadowing gallstones in the nondistended gallbladder. Mild diffuse gallbladder wall thickening without sonographic Murphy's sign, probably reactive or noninflammatory gallbladder wall edema. Electronically Signed   By: Michael [name] M.D.   On: 06/17/2015 20:28    Review of Systems  Constitutional: Positive for malaise/fatigue.  HENT: Negative.   Eyes: Negative.   Respiratory: Negative.   Cardiovascular:  Negative.   Gastrointestinal: Negative for nausea, vomiting, blood in stool and melena.       Bloating  Musculoskeletal: Positive for back pain.  Skin:       Turned yellow  Neurological: Negative.   Endo/Heme/Allergies: Negative.   Psychiatric/Behavioral: Positive for substance abuse.   Blood pressure 114/79, pulse 94, temperature 98.6 F (37 C), temperature source Oral, resp. rate 16, height 5' 9"  (1.753 m), weight 72.984 kg (160 lb 14.4 oz), SpO2 97 %. Physical Exam  Constitutional: He is oriented to person, place, and time. He appears well-developed. No distress.  HENT:  Head: Normocephalic and atraumatic.  Right Ear: External ear normal.  Left Ear: External ear normal.  Eyes: Conjunctivae are normal. Pupils are equal, round, and reactive to light. Scleral icterus is present.  Neck: Neck supple.  Cardiovascular: Normal rate, regular rhythm and intact distal pulses.   Respiratory: Effort normal. No respiratory distress. He exhibits no tenderness.  GI: Soft. He exhibits distension, fluid wave and ascites. He exhibits no mass. There is no tenderness. There is no rebound and no guarding.  Musculoskeletal: He exhibits no edema or tenderness.  Neurological: He is alert and oriented to person, place, and time. Coordination normal.  Skin: Skin is warm and dry. No rash noted. He is not diaphoretic. No erythema. No pallor.  Psychiatric: He has a normal mood and affect. His behavior is normal. Judgment and thought content normal.    Assessment/Plan: Gallbladder wall thickening likely secondary to protein calorie malnutrition and anasarca.  The patient is non tender in the RUQ and does not have a clinical history consistent with cholecystitis.    Elevated T Bili does not appear obstructive and seems to be related to acute liver failure Hyponatremia Ascites Acute kidney injury  Medicine to admit.    Please call if further clinical questions arise related to the gallbladder.  Can order  HIDA to assess for cholecystitis, but he may not take up the tracer well.    Michael [name] 06/17/2015, 11:19 PM

## 2015-06-17 NOTE — H&P (Signed)
History and Physical    Zakari Bathe ZOX:[id number] DOB: 05/05/1970 DOA: 06/17/2015  PCP: No PCP Per Patient  Patient coming from: Home.  Chief Complaint: Abdominal distention and jaundice.  HPI: Michael [name] is a 45 y.o. male with medical history significant of alcoholism presents to the ER because of increasing jaundice and abdominal distention. Patient states over the last 3 weeks patient was noticing increasing abdominal distention and lower extremity edema and family noticed patient having jaundice. Patient denies any nausea vomiting abdominal pain fever chills chest pain or shortness of breath. In the ER patient's total bilirubin is 23 and a sonogram of the abdomen shows moderate ascites and LFTs elevated. Patient states he drinks alcohol since age 21. Since there was some concern for gallbladder pathology general surgery was consulted.   ED Course: Labs done showed elevated LFTs particularly bilirubin 23 and INR 1.7 and creatinine 1.4.  Review of Systems: As per HPI otherwise 10 point review of systems negative.    Past Medical History  Diagnosis Date  . Anxiety     Past Surgical History  Procedure Laterality Date  . No past surgeries      SURGERY AS INFANT PYLORIC STENOSIS  . Orif wrist fracture Left 07/20/2014    Procedure: OPEN REDUCTION INTERNAL FIXATION (ORIF) WRIST FRACTURE;  Surgeon: Cammy Copa, MD;  Location: [facility] OR;  Service: Orthopedics;  Laterality: Left;  . Small intestine surgery      as an infant  . Open reduction internal fixation (orif) foot lisfranc fracture Right 08/06/2014    Procedure: RIGHT FOOT FRACTURE AND LISFRANC LIGAMENT INJURY FIXATION AND SUTURE REMOVAL LEFT WRIST INCISION;  Surgeon: Nadara Mustard, MD;  Location: [facility] OR;  Service: Orthopedics;  Laterality: Right;     reports that he has been smoking.  His smokeless tobacco use includes Snuff. He reports that he drinks about 1.2 oz of alcohol per week. He reports that he does not use illicit  drugs.  No Known Allergies  Family History  Problem Relation Age of Onset  . Prostate cancer Father   . Diabetes Mellitus II Paternal Grandfather     Prior to Admission medications   Medication Sig Start Date End Date Taking? Authorizing Provider  guaiFENesin (MUCINEX) 600 MG 12 hr tablet Take 600 mg by mouth 2 (two) times daily as needed for cough or to loosen phlegm.   Yes Historical Provider, MD  polyethylene glycol (MIRALAX / GLYCOLAX) packet Take 17 g by mouth daily as needed for mild constipation.   Yes Historical Provider, MD  aspirin EC 325 MG tablet Take 1 tablet (325 mg total) by mouth daily. Patient not taking: Reported on 06/17/2015 08/06/14   Nadara Mustard, MD  bacitracin ointment Apply 1 application topically 2 (two) times daily. Patient not taking: Reported on 06/17/2015 07/14/14   Arby Barrette, MD  ibuprofen (ADVIL,MOTRIN) 800 MG tablet Take 1 tablet (800 mg total) by mouth 3 (three) times daily. Patient not taking: Reported on 06/17/2015 07/14/14   Arby Barrette, MD  oxyCODONE-acetaminophen (PERCOCET/ROXICET) 5-325 MG per tablet Take 1-2 tablets by mouth every 4 (four) hours as needed for severe pain. Patient not taking: Reported on 06/17/2015 07/14/14   Arby Barrette, MD  oxyCODONE-acetaminophen (ROXICET) 5-325 MG per tablet Take 1 tablet by mouth every 4 (four) hours as needed for severe pain. Patient not taking: Reported on 06/17/2015 08/06/14   Nadara Mustard, MD    Physical Exam: Filed Vitals:   06/17/15 1845 06/17/15 1900 06/17/15  2130 06/17/15 2328  BP: 123/85 126/90 114/79 128/92  Pulse: 96 100 94 100  Temp:    98.3 F (36.8 C)  TempSrc:    Oral  Resp:    18  Height:      Weight:      SpO2: 97% 97% 97% 98%      Constitutional: Not in distress. Filed Vitals:   06/17/15 1845 06/17/15 1900 06/17/15 2130 06/17/15 2328  BP: 123/85 126/90 114/79 128/92  Pulse: 96 100 94 100  Temp:    98.3 F (36.8 C)  TempSrc:    Oral  Resp:    18  Height:      Weight:       SpO2: 97% 97% 97% 98%   Eyes: Icterus nor pallor. ENMT: No discharge from the ears eyes nose and mouth. Neck: No mass felt. No JVD appreciated. Respiratory: No rhonchi or crepitations. Cardiovascular: S1 and S2 heard. Abdomen: Distended nontender bowel sounds present. No guarding or rigidity. Musculoskeletal: Bilateral lower extremity edema. Skin: Appears pale. Neurologic: Alert awake oriented to time place and person. Moves all extremities. Psychiatric: Appears normal.   Labs on Admission: I have personally reviewed following labs and imaging studies  CBC:  Recent Labs Lab 06/17/15 1540  WBC 17.6*  HGB 12.1*  HCT 34.5*  MCV 101.5*  PLT 277   Basic Metabolic Panel:  Recent Labs Lab 06/17/15 1540  NA 123*  K 3.3*  CL 88*  CO2 23  GLUCOSE 104*  BUN 22*  CREATININE 1.43*  CALCIUM 8.2*   GFR: Estimated Creatinine Clearance: 64.5 mL/min (by C-G formula based on Cr of 1.43). Liver Function Tests:  Recent Labs Lab 06/17/15 1540 06/17/15 2126  AST 222*  --   ALT 58  --   ALKPHOS 237*  --   BILITOT 23.7* 21.9*  PROT 6.8  --   ALBUMIN 2.0*  --     Recent Labs Lab 06/17/15 1540  LIPASE 69*    Recent Labs Lab 06/17/15 2126  AMMONIA 55*   Coagulation Profile:  Recent Labs Lab 06/17/15 2126  INR 1.73*   Cardiac Enzymes: No results for input(s): CKTOTAL, CKMB, CKMBINDEX, TROPONINI in the last 168 hours. BNP (last 3 results) No results for input(s): PROBNP in the last 8760 hours. HbA1C: No results for input(s): HGBA1C in the last 72 hours. CBG: No results for input(s): GLUCAP in the last 168 hours. Lipid Profile: No results for input(s): CHOL, HDL, LDLCALC, TRIG, CHOLHDL, LDLDIRECT in the last 72 hours. Thyroid Function Tests: No results for input(s): TSH, T4TOTAL, FREET4, T3FREE, THYROIDAB in the last 72 hours. Anemia Panel: No results for input(s): VITAMINB12, FOLATE, FERRITIN, TIBC, IRON, RETICCTPCT in the last 72 hours. Urine analysis:     Component Value Date/Time   COLORURINE AMBER* 06/17/2015 1545   APPEARANCEUR CLOUDY* 06/17/2015 1545   LABSPEC 1.018 06/17/2015 1545   PHURINE 5.5 06/17/2015 1545   GLUCOSEU NEGATIVE 06/17/2015 1545   HGBUR NEGATIVE 06/17/2015 1545   BILIRUBINUR LARGE* 06/17/2015 1545   KETONESUR 15* 06/17/2015 1545   PROTEINUR NEGATIVE 06/17/2015 1545   NITRITE POSITIVE* 06/17/2015 1545   LEUKOCYTESUR SMALL* 06/17/2015 1545   Sepsis Labs: (procalcitonin:4,lacticidven:4) )No results found for this or any previous visit (from the past 240 hour(s)).   Radiological Exams on Admission: Dg Chest 2 View  06/17/2015  CLINICAL DATA:  Shortness of breath EXAM: CHEST  2 VIEW COMPARISON:  07/13/2014 chest radiograph. FINDINGS: Stable cardiomediastinal silhouette with normal heart size. No pneumothorax. No pleural  effusion. Minimal scarring versus atelectasis at the anterior left lung base. No pulmonary edema. No acute consolidative airspace disease. IMPRESSION: Minimal scarring versus atelectasis at the anterior left lung base. Otherwise no active disease in the chest. Electronically Signed   By: Delbert PhenixJason A Poff M.D.   On: 06/17/2015 19:29   [place] Abdomen Complete  06/17/2015  CLINICAL DATA:  Abdominal pain and distention.  Alcohol abuse. EXAM: ABDOMEN ULTRASOUND COMPLETE COMPARISON:  None. FINDINGS: Gallbladder: Nondistended gallbladder contains several tiny echogenic non shadowing foci in the dependent gallbladder measuring up to 5 mm in size, probably sludge balls or tiny non shadowing gallstones. Mild diffuse gallbladder wall thickening. No pericholecystic fluid or sonographic Murphy sign. Common bile duct: Diameter: 4 mm Liver: Liver parenchyma is diffusely coarsened and echogenic, there is posterior acoustic attenuation in the liver and the liver surface appears slightly irregular, suggesting cirrhosis in superimposed steatosis. No liver mass detected, noting decreased sensitivity in the setting of an  echogenic liver. IVC: No abnormality visualized. Pancreas: Poorly visualized due to overlying bowel gas. Spleen: Size and appearance within normal limits more (craniocaudal splenic length 12.1 cm). Right Kidney: Length: 11.4 cm. Echogenicity within normal limits. No mass or hydronephrosis visualized. Left Kidney: Length: 10.3 cm. Echogenicity within normal limits. No mass or hydronephrosis visualized. Abdominal aorta: No aneurysm visualized. Other findings: Moderate volume ascites. IMPRESSION: 1. Sonographic findings are suggestive of cirrhosis and superimposed hepatic steatosis. No liver mass detected. 2. Moderate volume ascites.  Top-normal size spleen. 3. Tiny sludge balls versus non shadowing gallstones in the nondistended gallbladder. Mild diffuse gallbladder wall thickening without sonographic Murphy's sign, probably reactive or noninflammatory gallbladder wall edema. Electronically Signed   By: Delbert PhenixJason A Poff M.D.   On: 06/17/2015 20:28    Assessment/Plan Principal Problem:   Alcoholic cirrhosis of liver with ascites (HCC) Active Problems:   Liver failure (HCC)   Acute alcoholic hepatitis   Coagulopathy (HCC)   Macrocytic anemia   ARF (acute renal failure) (HCC)   Ascites    #1. Cirrhosis of liver probably secondary to alcoholism with ascites, decompensated new diagnosis of liver cirrhosis - patient has significant ascites and lower extremity edema. Given the patient also has renal failure at this time I'm hesitant to start diuretics. I have ordered for both therapeutic and diagnostic paracentesis and until results are available patient will be on ceftriaxone for SBP coverage. Since patient also has renal failure I have ordered albumin infusion 6 g for every 1 L of paracentesis fluid tapped. I have discussed this with pharmacy. Acute hepatitis panel has been ordered. Follow paracentesis labs. #2. Alcoholic hepatitis - patient's determinant factor is 61 and MELD score is 28. Patient probably will  benefit from prednisone but we will first have to rule out any infectious sources for which paracentesis has been ordered and acute hepatitis panel has been ordered. #3. Coagulopathy secondary to #1 - I have ordered 1 dose of vitamin K follow INR. #4. Macrocytic anemia - follow anemia panel and CBC. #5. Renal failure probably acute and old labs to compare - see #1 regarding paracentesis and albumin infusion. Patient probably will need eventually Lasix and spironolactone if creatinine allows. #6. Hyponatremia probably from fluid overload - check urine sodium and osmolality. Closely follow metabolic panel. #7. Alcohol abuse and tobacco abuse - patient advised to quit. CIWA protocol.  I did discuss with Dr. Donell BeersByerly general surgeon who at this time feels that patient's gallbladder pathology is most likely secondary to ascites. No definite evidence of any cholecystitis.  DVT prophylaxis: SCDs since patient has coagulopathy. Code Status: Full code.  Family Communication: Family at the bedside.  Disposition Plan: Home.  Consults called: General surgery for abnormal gallbladder seen.  Admission status: Inpatient. Telemetry. Likely stay 3-4 days.    Eduard Clos MD [facility] Pager [phone].  If 7PM-7AM, please contact night-coverage [email or web address] Password [facility]  06/17/2015, 11:41 PM

## 2015-06-17 NOTE — ED Notes (Addendum)
Patient comes from home stating he has not has a smoke or drank alcohol since Saturday.  Now today he started having some abdominal distention and swelling of the ankles.  Also having some yellowing of the eyes bilaterally. Patient denies headache but does have some back pain.  Alert and oriented at this time

## 2015-06-17 NOTE — ED Provider Notes (Signed)
CSN: [id number]     Arrival date & time 06/17/15  1530 History   None    Chief Complaint  Patient presents with  . Joint Swelling    ankles bilaterally   HPI Comments: 45 year old male who presents with abdominal distension and jaundice. No significant PMH. He states he is a heavy drinker. Last ETOH intake was a week ago. Denies hx of liver disease, hx of hepatitis, IVDU. He states over the past several weeks he has noticed his abdomen swelling as well as bilateral feet swelling. He has also had yellowing of his skin and urine has been dark. Family reports he has had multiple falls at work and they came to the ED tonight because his coworkers were concerned that he would be unable to get home. He reports associated chills, SOB, abdominal pain, and constipation. His abdominal pain is generalized, worse with movement, and sharp at times. Denies fever, HA, confusion, chest pain, wheezing, N/V/D. Pt is a current smoker. He works as a [employer].   Past Medical History  Diagnosis Date  . Anxiety    Past Surgical History  Procedure Laterality Date  . No past surgeries      SURGERY AS INFANT PYLORIC STENOSIS  . Orif wrist fracture Left 07/20/2014    Procedure: OPEN REDUCTION INTERNAL FIXATION (ORIF) WRIST FRACTURE;  Surgeon: Cammy CopaScott Gregory Dean, MD;  Location: [facility] OR;  Service: Orthopedics;  Laterality: Left;  . Small intestine surgery      as an infant  . Open reduction internal fixation (orif) foot lisfranc fracture Right 08/06/2014    Procedure: RIGHT FOOT FRACTURE AND LISFRANC LIGAMENT INJURY FIXATION AND SUTURE REMOVAL LEFT WRIST INCISION;  Surgeon: Nadara MustardMarcus Duda V, MD;  Location: [facility] OR;  Service: Orthopedics;  Laterality: Right;   History reviewed. No pertinent family history. Social History  Substance Use Topics  . Smoking status: Current Every Day Smoker -- 1.00 packs/day  . Smokeless tobacco: Current User    Types: Snuff  . Alcohol Use: 1.2 oz/week    2 Cans of beer per week    Review  of Systems  Constitutional: Positive for chills. Negative for fever.  Respiratory: Positive for shortness of breath. Negative for cough and wheezing.   Cardiovascular: Positive for leg swelling. Negative for chest pain.  Gastrointestinal: Positive for abdominal pain and abdominal distention. Negative for nausea, vomiting, diarrhea and constipation.  Genitourinary: Negative for dysuria and frequency.  Musculoskeletal: Positive for joint swelling.  Psychiatric/Behavioral: Negative for confusion.  All other systems reviewed and are negative.   Allergies  Review of patient's allergies indicates no known allergies.  Home Medications   Prior to Admission medications   Medication Sig Start Date End Date Taking? Authorizing Provider  guaiFENesin (MUCINEX) 600 MG 12 hr tablet Take 600 mg by mouth 2 (two) times daily as needed for cough or to loosen phlegm.   Yes Historical Provider, MD  polyethylene glycol (MIRALAX / GLYCOLAX) packet Take 17 g by mouth daily as needed for mild constipation.   Yes Historical Provider, MD  aspirin EC 325 MG tablet Take 1 tablet (325 mg total) by mouth daily. Patient not taking: Reported on 06/17/2015 08/06/14   Nadara MustardMarcus Duda V, MD  bacitracin ointment Apply 1 application topically 2 (two) times daily. Patient not taking: Reported on 06/17/2015 07/14/14   Arby BarretteMarcy Pfeiffer, MD  ibuprofen (ADVIL,MOTRIN) 800 MG tablet Take 1 tablet (800 mg total) by mouth 3 (three) times daily. Patient not taking: Reported on 06/17/2015 07/14/14  Arby Barrette, MD  oxyCODONE-acetaminophen (PERCOCET/ROXICET) 5-325 MG per tablet Take 1-2 tablets by mouth every 4 (four) hours as needed for severe pain. Patient not taking: Reported on 06/17/2015 07/14/14   Arby Barrette, MD  oxyCODONE-acetaminophen (ROXICET) 5-325 MG per tablet Take 1 tablet by mouth every 4 (four) hours as needed for severe pain. Patient not taking: Reported on 06/17/2015 08/06/14   Nadara Mustard, MD   BP 155/83 mmHg  Pulse 110   Temp(Src) 98.6 F (37 C) (Oral)  Resp 16  Ht  (1.753 m)  Wt 72.984 kg  BMI 23.75 kg/m2  SpO2 99%   Physical Exam  Constitutional: He is oriented to person, place, and time. He has a sickly appearance. He appears ill. No distress.  Jaundiced  HENT:  Head: Normocephalic and atraumatic.  Eyes: Conjunctivae are normal. Pupils are equal, round, and reactive to light. Right eye exhibits no discharge. Left eye exhibits no discharge. Scleral icterus is present.  Neck: Normal range of motion.  Cardiovascular: Regular rhythm.  Tachycardia present.  Exam reveals no gallop and no friction rub.   No murmur heard. Pulmonary/Chest: Effort normal and breath sounds normal. No respiratory distress. He has no wheezes. He has no rales. He exhibits no tenderness.  Bibasilar crackles  Abdominal: Soft. Bowel sounds are normal. He exhibits distension and ascites. He exhibits no pulsatile liver. There is hepatomegaly. There is generalized tenderness. No hernia.  Musculoskeletal:  2+ bilateral pitting edema  Neurological: He is alert and oriented to person, place, and time.  Skin: Skin is warm and dry. He is not diaphoretic.  Psychiatric: He has a normal mood and affect. His speech is normal and behavior is normal. Judgment and thought content normal. Cognition and memory are normal.    ED Course  Procedures (including critical care time) Labs Review Labs Reviewed  LIPASE, BLOOD - Abnormal; Notable for the following:    Lipase 69 (*)    All other components within normal limits  COMPREHENSIVE METABOLIC PANEL - Abnormal; Notable for the following:    Sodium 123 (*)    Potassium 3.3 (*)    Chloride 88 (*)    Glucose, Bld 104 (*)    BUN 22 (*)    Creatinine, Ser 1.43 (*)    Calcium 8.2 (*)    Albumin 2.0 (*)    AST 222 (*)    Alkaline Phosphatase 237 (*)    Total Bilirubin 23.7 (*)    GFR calc non Af Amer 57 (*)    All other components within normal limits  CBC - Abnormal; Notable for the  following:    WBC 17.6 (*)    RBC 3.40 (*)    Hemoglobin 12.1 (*)    HCT 34.5 (*)    MCV 101.5 (*)    MCH 35.6 (*)    All other components within normal limits  URINALYSIS, ROUTINE W REFLEX MICROSCOPIC (NOT AT [facility]) - Abnormal; Notable for the following:    Color, Urine AMBER (*)    APPearance CLOUDY (*)    Bilirubin Urine LARGE (*)    Ketones, ur 15 (*)    Nitrite POSITIVE (*)    Leukocytes, UA SMALL (*)    All other components within normal limits  URINE MICROSCOPIC-ADD ON - Abnormal; Notable for the following:    Squamous Epithelial / LPF 0-5 (*)    Bacteria, UA FEW (*)    Casts HYALINE CASTS (*)    All other components within normal limits  PROTIME-INR  HEPATITIS PANEL, ACUTE  ETHANOL  AMMONIA  BILIRUBIN, FRACTIONATED(TOT/DIR/INDIR)    Imaging Review Dg Chest 2 View  06/17/2015  CLINICAL DATA:  Shortness of breath EXAM: CHEST  2 VIEW COMPARISON:  07/13/2014 chest radiograph. FINDINGS: Stable cardiomediastinal silhouette with normal heart size. No pneumothorax. No pleural effusion. Minimal scarring versus atelectasis at the anterior left lung base. No pulmonary edema. No acute consolidative airspace disease. IMPRESSION: Minimal scarring versus atelectasis at the anterior left lung base. Otherwise no active disease in the chest. Electronically Signed   By: Delbert Phenix M.D.   On: 06/17/2015 19:29   I have personally reviewed and evaluated these images and lab results as part of my medical decision-making.   EKG Interpretation None      MDM   Final diagnoses:  Ascites   45 year old male presents with new onset of liver failure. He is tachycardic and hypertensive however he is afebrile, not tachypneic or hypoxic. He is extremely jaundiced, has scleral icterus, ascites, and bialteral lower leg edema. CBC is remarkable for leukocytosis of 17.6. CMP is remarkable for BUN/SCr of 22/1.43, total bilirubin of 23.7, AST of 222, ALT 58, AP 237. Lipase is 69. UA shows large amt of  bilirubin, 15 ketones, nitrite positive, small leukocytes, and few bacteria, hyaline casts. CXR is negative.   Will obtain Complete US, hepatitis panel, ethanol level ammonia level, PT/INR, and fractionated bili. Patient signed out at shift change to Dr. Broadus John.   Bethel Born, PA-C 06/17/15 2014  Arby Barrette, MD 06/17/15 2221

## 2015-06-17 NOTE — ED Notes (Signed)
Pt ate approx 50% from dinner tray

## 2015-06-18 ENCOUNTER — Inpatient Hospital Stay (HOSPITAL_COMMUNITY): Payer: Self-pay

## 2015-06-18 LAB — HEPATIC FUNCTION PANEL
ALBUMIN: 1.8 g/dL — AB (ref 3.5–5.0)
ALT: 49 U/L (ref 17–63)
AST: 201 U/L — ABNORMAL HIGH (ref 15–41)
Alkaline Phosphatase: 219 U/L — ABNORMAL HIGH (ref 38–126)
BILIRUBIN TOTAL: 21.6 mg/dL — AB (ref 0.3–1.2)
Bilirubin, Direct: 14.4 mg/dL — ABNORMAL HIGH (ref 0.1–0.5)
Indirect Bilirubin: 7.2 mg/dL — ABNORMAL HIGH (ref 0.3–0.9)
Total Protein: 6.5 g/dL (ref 6.5–8.1)

## 2015-06-18 LAB — IRON AND TIBC
Iron: 31 ug/dL — ABNORMAL LOW (ref 45–182)
SATURATION RATIOS: 18 % (ref 17.9–39.5)
TIBC: 176 ug/dL — AB (ref 250–450)
UIBC: 145 ug/dL

## 2015-06-18 LAB — GLUCOSE, SEROUS FLUID: Glucose, Fluid: 110 mg/dL

## 2015-06-18 LAB — ALBUMIN, FLUID (OTHER): Albumin, Fluid: <1 g/dL

## 2015-06-18 LAB — BODY FLUID CELL COUNT WITH DIFFERENTIAL
EOS FL: 0 %
Lymphs, Fluid: 49 %
MONOCYTE-MACROPHAGE-SEROUS FLUID: 36 % — AB (ref 50–90)
NEUTROPHIL FLUID: 15 % (ref 0–25)
Total Nucleated Cell Count, Fluid: 92 cu mm (ref 0–1000)

## 2015-06-18 LAB — CBC WITH DIFFERENTIAL/PLATELET
BASOS PCT: 0 %
Basophils Absolute: 0 10*3/uL (ref 0.0–0.1)
EOS ABS: 0 10*3/uL (ref 0.0–0.7)
Eosinophils Relative: 0 %
HEMATOCRIT: 34.3 % — AB (ref 39.0–52.0)
HEMOGLOBIN: 12 g/dL — AB (ref 13.0–17.0)
LYMPHS ABS: 1.1 10*3/uL (ref 0.7–4.0)
Lymphocytes Relative: 7 %
MCH: 35.6 pg — AB (ref 26.0–34.0)
MCHC: 35 g/dL (ref 30.0–36.0)
MCV: 101.8 fL — ABNORMAL HIGH (ref 78.0–100.0)
Monocytes Absolute: 1.1 10*3/uL — ABNORMAL HIGH (ref 0.1–1.0)
Monocytes Relative: 7 %
NEUTROS ABS: 13.5 10*3/uL — AB (ref 1.7–7.7)
NEUTROS PCT: 86 %
Platelets: 274 10*3/uL (ref 150–400)
RBC: 3.37 MIL/uL — AB (ref 4.22–5.81)
RDW: 14 % (ref 11.5–15.5)
WBC: 15.8 10*3/uL — AB (ref 4.0–10.5)

## 2015-06-18 LAB — GRAM STAIN

## 2015-06-18 LAB — BASIC METABOLIC PANEL
ANION GAP: 13 (ref 5–15)
BUN: 23 mg/dL — ABNORMAL HIGH (ref 6–20)
CHLORIDE: 88 mmol/L — AB (ref 101–111)
CO2: 23 mmol/L (ref 22–32)
Calcium: 8 mg/dL — ABNORMAL LOW (ref 8.9–10.3)
Creatinine, Ser: 1.58 mg/dL — ABNORMAL HIGH (ref 0.61–1.24)
GFR calc non Af Amer: 51 mL/min — ABNORMAL LOW (ref >60)
GFR, EST AFRICAN AMERICAN: 59 mL/min — AB (ref >60)
Glucose, Bld: 106 mg/dL — ABNORMAL HIGH (ref 65–99)
POTASSIUM: 2.9 mmol/L — AB (ref 3.5–5.1)
Sodium: 124 mmol/L — ABNORMAL LOW (ref 135–145)

## 2015-06-18 LAB — OSMOLALITY, URINE: OSMOLALITY UR: 392 mosm/kg (ref 300–900)

## 2015-06-18 LAB — RETICULOCYTES
RBC.: 3.21 MIL/uL — ABNORMAL LOW (ref 4.22–5.81)
Retic Count, Absolute: 48.2 10*3/uL (ref 19.0–186.0)
Retic Ct Pct: 1.5 % (ref 0.4–3.1)

## 2015-06-18 LAB — PROTIME-INR
INR: 1.86 — AB (ref 0.00–1.49)
Prothrombin Time: 21.4 seconds — ABNORMAL HIGH (ref 11.6–15.2)

## 2015-06-18 LAB — PROTEIN, BODY FLUID: Total protein, fluid: <3 g/dL

## 2015-06-18 LAB — FOLATE: Folate: 4.6 ng/mL — ABNORMAL LOW

## 2015-06-18 LAB — FERRITIN: Ferritin: 137 ng/mL (ref 24–336)

## 2015-06-18 LAB — VITAMIN B12: Vitamin B-12: 1147 pg/mL — ABNORMAL HIGH (ref 180–914)

## 2015-06-18 LAB — SODIUM, URINE, RANDOM: Sodium, Ur: <10 mmol/L

## 2015-06-18 MED ORDER — BACITRACIN ZINC 500 UNIT/GM EX OINT
TOPICAL_OINTMENT | Freq: Two times a day (BID) | CUTANEOUS | Status: DC
  Administered 2015-06-18: 1 via TOPICAL
  Administered 2015-06-18 – 2015-06-19 (×3): via TOPICAL
  Administered 2015-06-20: 1 via TOPICAL
  Administered 2015-06-20 – 2015-06-22 (×4): via TOPICAL
  Filled 2015-06-18: qty 28.35

## 2015-06-18 MED ORDER — POTASSIUM CHLORIDE CRYS ER 20 MEQ PO TBCR
40.0000 meq | EXTENDED_RELEASE_TABLET | Freq: Once | ORAL | Status: AC
  Administered 2015-06-18: 40 meq via ORAL
  Filled 2015-06-18: qty 2

## 2015-06-18 MED ORDER — PENTOXIFYLLINE ER 400 MG PO TBCR
400.0000 mg | EXTENDED_RELEASE_TABLET | Freq: Three times a day (TID) | ORAL | Status: DC
  Administered 2015-06-18 – 2015-06-20 (×4): 400 mg via ORAL
  Filled 2015-06-18 (×7): qty 1

## 2015-06-18 MED ORDER — HYDROCORTISONE 1 % EX CREA
TOPICAL_CREAM | Freq: Two times a day (BID) | CUTANEOUS | Status: DC
  Administered 2015-06-18 – 2015-06-19 (×3): via TOPICAL
  Administered 2015-06-20: 1 via TOPICAL
  Administered 2015-06-20 – 2015-06-22 (×4): via TOPICAL
  Filled 2015-06-18: qty 28

## 2015-06-18 MED ORDER — ALBUMIN HUMAN 25 % IV SOLN
12.5000 g | Freq: Once | INTRAVENOUS | Status: AC
  Administered 2015-06-18: 12.5 g via INTRAVENOUS
  Filled 2015-06-18: qty 50

## 2015-06-18 MED ORDER — PREDNISOLONE 5 MG PO TABS
10.0000 mg | ORAL_TABLET | Freq: Two times a day (BID) | ORAL | Status: DC
  Administered 2015-06-18 – 2015-06-20 (×5): 10 mg via ORAL
  Filled 2015-06-18 (×5): qty 2

## 2015-06-18 MED ORDER — LIDOCAINE HCL (PF) 1 % IJ SOLN
INTRAMUSCULAR | Status: AC
  Filled 2015-06-18: qty 10

## 2015-06-18 NOTE — Progress Notes (Signed)
CRITICAL VALUE ALERT  Critical value received:  Total Bilirubin 21.6  Date of notification:  06/18/15  Time of notification:  0930  Critical value read back:Yes.    Nurse who received alert:  Derryl HarborLonnie, RN  MD notified (1st page):  Dr. Catha GosselinMikhail  Time of first page:  0945  MD notified (2nd page):  Time of second page:  Responding MD:  Dr. Catha GosselinMikhail  Time MD responded:  [phone]

## 2015-06-18 NOTE — Progress Notes (Signed)
PROGRESS NOTE    Michael [name]  ZOX:[id number] DOB: 19-Nov-1970 DOA: 06/17/2015 PCP: No PCP Per Patient   Brief Narrative:  On 06/17/2015 by Dr. [name] Lajarvis [name] is a 45 y.o. male with medical history significant of alcoholism presents to the ER because of increasing jaundice and abdominal distention. Patient states over the last 3 weeks patient was noticing increasing abdominal distention and lower extremity edema and family noticed patient having jaundice. Patient denies any nausea vomiting abdominal pain fever chills chest pain or shortness of breath. In the ER patient's total bilirubin is 23 and a sonogram of the abdomen shows moderate ascites and LFTs elevated. Patient states he drinks alcohol since age 12. Since there was some concern for gallbladder pathology general surgery was consulted.   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 Need for steroids -Holding diuretics due to acute kidney injury -Hepatitis panel pending  Coagulopathy -Secondary to the above, INR 1.86 -Vitamin K has been ordered -Continue to monitor INR  Macrocytic anemia -Anemia panel: Iron 31, ferritin 137, TIBC 176 -Vitamin D 01/08/1948 -Hemoglobin currently 12, continue to monitor CBC  Acute kidney injury -Creatinine 2016 0.99, currently 1.58 -Holding IV fluids due to volume overload -Continue to monitor CMP  Hyponatremia -Likely secondary to volume overload -Urine osmolality 392, urine sodium less than 10 -Sodium 124 -Continue to monitor CMP  Alcohol and tobacco abuse -Discussed cessation -Continue CIWA protocol  DVT Prophylaxis  SCDs  Code Status: Full  Family Communication: Fiancee at bedside  Disposition Plan: Admitted. Pending workup  and GI consult and recommendation  Consultants Gastroenterology   Procedures  Abdominal US Paracentesis  Antibiotics   Anti-infectives    Start     Dose/Rate Route Frequency Ordered Stop   06/18/15 0000  cefTRIAXone (ROCEPHIN) 2 g in dextrose 5 % 50 mL IVPB     2 g 100 mL/hr over 30 Minutes Intravenous Every 24 hours 06/17/15 2341        Subjective:   [name] seen and examined today.   Patient denies abdominal pain, does feel bloating shortly swelling. Denies nausea or vomiting. Currently denies chest pain or shortness of breath.  Objective:   Filed Vitals:   06/18/15 0912 06/18/15 0916 06/18/15 0925 06/18/15 1112  BP: 111/77 114/81 112/83 117/73  Pulse:    95  Temp:      TempSrc:      Resp:    20  Height:      Weight:      SpO2:    99%    Intake/Output Summary (Last 24 hours) at 06/18/15 1147 Last data filed at 06/18/15 1131  Gross per 24 hour  Intake    490 ml  Output    100 ml  Net    390 ml   Filed Weights   06/17/15 1538 06/18/15 0700  Weight: 72.984 kg (160 lb 14.4 oz) 70.8 kg (156 lb 1.4 oz)    Exam  General: Well developed, well nourished, NAD, appears stated age  HEENT: NCAT, PERRLA, EOMI, Sclera icterus, mucous membranes moist.   Cardiovascular: S1 S2 auscultated, no rubs, murmurs or gallops. Regular rate and rhythm.  Respiratory: Clear to auscultation bilaterally  Abdomen: Soft, nontender, +distended, + bowel sounds  Extremities: warm dry without cyanosis clubbing. 2+ LE Edema B/L  Neuro: AAOx3, nonfocal  Skin: Without  rashes exudates or nodules, jaundice  Psych: Normal affect and demeanor  Data Reviewed: I have personally reviewed following labs and imaging studies  CBC:  Recent Labs Lab 06/17/15 1540 06/18/15 0644  WBC 17.6* 15.8*  NEUTROABS  --  13.5*  HGB 12.1* 12.0*  HCT 34.5* 34.3*  MCV 101.5* 101.8*  PLT 277 274   Basic Metabolic Panel:  Recent Labs Lab 06/17/15 1540 06/18/15 0644  NA 123* 124*  K 3.3*  2.9*  CL 88* 88*  CO2 23 23  GLUCOSE 104* 106*  BUN 22* 23*  CREATININE 1.43* 1.58*  CALCIUM 8.2* 8.0*   GFR: Estimated Creatinine Clearance: 58.4 mL/min (by C-G formula based on Cr of 1.58). Liver Function Tests:  Recent Labs Lab 06/17/15 1540 06/17/15 2126 06/18/15 0644  AST 222*  --  201*  ALT 58  --  49  ALKPHOS 237*  --  219*  BILITOT 23.7* 21.9* 21.6*  PROT 6.8  --  6.5  ALBUMIN 2.0*  --  1.8*    Recent Labs Lab 06/17/15 1540  LIPASE 69*    Recent Labs Lab 06/17/15 2126  AMMONIA 55*   Coagulation Profile:  Recent Labs Lab 06/17/15 2126 06/18/15 0644  INR 1.73* 1.86*   Cardiac Enzymes: No results for input(s): CKTOTAL, CKMB, CKMBINDEX, TROPONINI in the last 168 hours. BNP (last 3 results) No results for input(s): PROBNP in the last 8760 hours. HbA1C: No results for input(s): HGBA1C in the last 72 hours. CBG: No results for input(s): GLUCAP in the last 168 hours. Lipid Profile: No results for input(s): CHOL, HDL, LDLCALC, TRIG, CHOLHDL, LDLDIRECT in the last 72 hours. Thyroid Function Tests: No results for input(s): TSH, T4TOTAL, FREET4, T3FREE, THYROIDAB in the last 72 hours. Anemia Panel:  Recent Labs  06/18/15 0010  VITAMINB12 1147*  FOLATE 4.6*  FERRITIN 137  TIBC 176*  IRON 31*  RETICCTPCT 1.5   Urine analysis:    Component Value Date/Time   COLORURINE AMBER* 06/17/2015 1545   APPEARANCEUR CLOUDY* 06/17/2015 1545   LABSPEC 1.018 06/17/2015 1545   PHURINE 5.5 06/17/2015 1545   GLUCOSEU NEGATIVE 06/17/2015 1545   HGBUR NEGATIVE 06/17/2015 1545   BILIRUBINUR LARGE* 06/17/2015 1545   KETONESUR 15* 06/17/2015 1545   PROTEINUR NEGATIVE 06/17/2015 1545   NITRITE POSITIVE* 06/17/2015 1545   LEUKOCYTESUR SMALL* 06/17/2015 1545   Sepsis Labs: @LABRCNTIP (procalcitonin:4,lacticidven:4)  ) Recent Results (from the past 240 hour(s))  Gram stain     Status: None   Collection Time: 06/18/15  9:33 AM  Result Value Ref Range Status    Specimen Description FLUID PERITONEAL  Final   Special Requests NONE  Final   Gram Stain   Final    FEW WBC PRESENT, PREDOMINANTLY MONONUCLEAR NO ORGANISMS SEEN    Report Status 06/18/2015 FINAL  Final      Radiology Studies: Dg Chest 2 View  06/17/2015  CLINICAL DATA:  Shortness of breath EXAM: CHEST  2 VIEW COMPARISON:  07/13/2014 chest radiograph. FINDINGS: Stable cardiomediastinal silhouette with normal heart size. No pneumothorax. No pleural effusion. Minimal scarring versus atelectasis at the anterior left lung base. No pulmonary edema. No acute consolidative airspace disease. IMPRESSION: Minimal scarring versus atelectasis at the anterior left lung base. Otherwise no active disease in the chest. Electronically Signed   By: Delbert Phenix M.D.   On: 06/17/2015 19:29   US Abdomen Complete  06/17/2015  CLINICAL DATA:  Abdominal pain and distention.  Alcohol abuse. EXAM: ABDOMEN ULTRASOUND COMPLETE COMPARISON:  None. FINDINGS:  Gallbladder: Nondistended gallbladder contains several tiny echogenic non shadowing foci in the dependent gallbladder measuring up to 5 mm in size, probably sludge balls or tiny non shadowing gallstones. Mild diffuse gallbladder wall thickening. No pericholecystic fluid or sonographic Murphy sign. Common bile duct: Diameter: 4 mm Liver: Liver parenchyma is diffusely coarsened and echogenic, there is posterior acoustic attenuation in the liver and the liver surface appears slightly irregular, suggesting cirrhosis in superimposed steatosis. No liver mass detected, noting decreased sensitivity in the setting of an echogenic liver. IVC: No abnormality visualized. Pancreas: Poorly visualized due to overlying bowel gas. Spleen: Size and appearance within normal limits more (craniocaudal splenic length 12.1 cm). Right Kidney: Length: 11.4 cm. Echogenicity within normal limits. No mass or hydronephrosis visualized. Left Kidney: Length: 10.3 cm. Echogenicity within normal limits. No  mass or hydronephrosis visualized. Abdominal aorta: No aneurysm visualized. Other findings: Moderate volume ascites. IMPRESSION: 1. Sonographic findings are suggestive of cirrhosis and superimposed hepatic steatosis. No liver mass detected. 2. Moderate volume ascites.  Top-normal size spleen. 3. Tiny sludge balls versus non shadowing gallstones in the nondistended gallbladder. Mild diffuse gallbladder wall thickening without sonographic Murphy's sign, probably reactive or noninflammatory gallbladder wall edema. Electronically Signed   By: Delbert PhenixJason A Poff M.D.   On: 06/17/2015 20:28   [place] Paracentesis  06/18/2015  INDICATION: Jaundice; ascites EXAM: ULTRASOUND-GUIDED PARACENTESIS COMPARISON:  None. MEDICATIONS: 10 cc 1% lidocaine COMPLICATIONS: None immediate. TECHNIQUE: Informed written consent was obtained from the patient after a discussion of the risks, benefits and alternatives to treatment. A timeout was performed prior to the initiation of the procedure. Initial ultrasound scanning demonstrates a large amount of ascites within the right lower abdominal quadrant. The right lower abdomen was prepped and draped in the usual sterile fashion. 1% lidocaine with epinephrine was used for local anesthesia. Under direct ultrasound guidance, a 19 gauge, 7-cm, Yueh catheter was introduced. An ultrasound image was saved for documentation purposed. The paracentesis was performed. The catheter was removed and a dressing was applied. The patient tolerated the procedure well without immediate post procedural complication. FINDINGS: A total of approximately 3 liters of dark yellow fluid was removed. Samples were sent to the laboratory as requested by the clinical team. IMPRESSION: Successful ultrasound-guided paracentesis yielding 3 liters of peritoneal fluid. Read by:  Robet LeuPamela A Turpin [facility] Electronically Signed   By: Simonne ComeJohn  Watts M.D.   On: 06/18/2015 09:50     Scheduled Meds: . cefTRIAXone (ROCEPHIN)  IV  2 g Intravenous  Q24H  . folic acid  1 mg Oral Daily  . lidocaine (PF)      . LORazepam  0-4 mg Oral Q6H   Followed by  . [START ON 06/20/2015] LORazepam  0-4 mg Oral Q12H  . multivitamin with minerals  1 tablet Oral Daily  . sodium chloride flush  3 mL Intravenous Q12H  . thiamine  100 mg Oral Daily   Or  . thiamine  100 mg Intravenous Daily   Continuous Infusions:    LOS: 1 day   Time Spent in minutes   30 minutes  Juelz Claar D.O. on 06/18/2015 at 11:47 AM  Between 7am to 7pm - Pager - [phone]  After 7pm go to [email or web address] - password [email or web address]  And look for the night coverage person covering for me after hours  [facility] Office  [phone]

## 2015-06-18 NOTE — Consult Note (Signed)
[facility] CONSULT Reason for consult: Worsening Liver Function Referring Physician: [facility] hospitalist. PCP: none  Michael [name] is an 45 y.o. male.  HPI: he has a history of heavy alcohol abuse dating back 25 years. Possibly longer. He has had no alcohol now for about 6 days. He presented with jaundice abdominal distention and back pain. His labs revealed marked elevation of LFTs. Total bilirubin 23.4 AST 222, ALT 58 albumin 2.0iNR INR 1.7 Pro time 20.2. Patient just returned from paracentesis 3 L of peritoneal fluid removed. Ultrasound had shown some possible sludge in the gallbladder.  Past Medical History  Diagnosis Date  . Anxiety     Past Surgical History  Procedure Laterality Date  . No past surgeries      SURGERY AS INFANT PYLORIC STENOSIS  . Orif wrist fracture Left 07/20/2014    Procedure: OPEN REDUCTION INTERNAL FIXATION (ORIF) WRIST FRACTURE;  Surgeon: Meredith Pel, MD;  Location: [place];  Service: Orthopedics;  Laterality: Left;  . Small intestine surgery      as an infant  . Open reduction internal fixation (orif) foot lisfranc fracture Right 08/06/2014    Procedure: RIGHT FOOT FRACTURE AND LISFRANC LIGAMENT INJURY FIXATION AND SUTURE REMOVAL LEFT WRIST INCISION;  Surgeon: Newt Minion, MD;  Location: [place];  Service: Orthopedics;  Laterality: Right;    Family History  Problem Relation Age of Onset  . Prostate cancer Father   . Diabetes Mellitus II Paternal Grandfather     Social History:  reports that he has been smoking.  His smokeless tobacco use includes Snuff. He reports that he drinks about 1.2 oz of alcohol per week. He reports that he does not use illicit drugs.  Allergies: No Known Allergies  Medications; Prior to Admission medications   Medication Sig Start Date End Date Taking? Authorizing Provider  guaiFENesin (MUCINEX) 600 MG 12 hr tablet Take 600 mg by mouth 2 (two) times daily as needed for cough or to loosen phlegm.   Yes Historical  Provider, MD  polyethylene glycol (MIRALAX / GLYCOLAX) packet Take 17 g by mouth daily as needed for mild constipation.   Yes Historical Provider, MD  aspirin EC 325 MG tablet Take 1 tablet (325 mg total) by mouth daily. Patient not taking: Reported on 06/17/2015 08/06/14   Newt Minion, MD  bacitracin ointment Apply 1 application topically 2 (two) times daily. Patient not taking: Reported on 06/17/2015 07/14/14   Charlesetta Shanks, MD  ibuprofen (ADVIL,MOTRIN) 800 MG tablet Take 1 tablet (800 mg total) by mouth 3 (three) times daily. Patient not taking: Reported on 06/17/2015 07/14/14   Charlesetta Shanks, MD  oxyCODONE-acetaminophen (PERCOCET/ROXICET) 5-325 MG per tablet Take 1-2 tablets by mouth every 4 (four) hours as needed for severe pain. Patient not taking: Reported on 06/17/2015 07/14/14   Charlesetta Shanks, MD  oxyCODONE-acetaminophen (ROXICET) 5-325 MG per tablet Take 1 tablet by mouth every 4 (four) hours as needed for severe pain. Patient not taking: Reported on 06/17/2015 08/06/14   Newt Minion, MD   . bacitracin   Topical BID  . cefTRIAXone (ROCEPHIN)  IV  2 g Intravenous Q24H  . folic acid  1 mg Oral Daily  . lidocaine (PF)      . LORazepam  0-4 mg Oral Q6H   Followed by  . [START ON 06/20/2015] LORazepam  0-4 mg Oral Q12H  . multivitamin with minerals  1 tablet Oral Daily  . pentoxifylline  400 mg Oral TID WC  . prednisoLONE  10 mg Oral BID  . sodium chloride flush  3 mL Intravenous Q12H  . thiamine  100 mg Oral Daily   Or  . thiamine  100 mg Intravenous Daily   PRN Meds LORazepam **OR** LORazepam, ondansetron **OR** ondansetron (ZOFRAN) IV Results for orders placed or performed during the hospital encounter of 06/17/15 (from the past 48 hour(s))  Lipase, blood     Status: Abnormal   Collection Time: 06/17/15  3:40 PM  Result Value Ref Range   Lipase 69 (H) 11 - 51 U/L  Comprehensive metabolic panel     Status: Abnormal   Collection Time: 06/17/15  3:40 PM  Result Value Ref Range    Sodium 123 (L) 135 - 145 mmol/L   Potassium 3.3 (L) 3.5 - 5.1 mmol/L   Chloride 88 (L) 101 - 111 mmol/L   CO2 23 22 - 32 mmol/L   Glucose, Bld 104 (H) 65 - 99 mg/dL   BUN 22 (H) 6 - 20 mg/dL   Creatinine, Ser 1.43 (H) 0.61 - 1.24 mg/dL   Calcium 8.2 (L) 8.9 - 10.3 mg/dL   Total Protein 6.8 6.5 - 8.1 g/dL   Albumin 2.0 (L) 3.5 - 5.0 g/dL   AST 222 (H) 15 - 41 U/L   ALT 58 17 - 63 U/L   Alkaline Phosphatase 237 (H) 38 - 126 U/L   Total Bilirubin 23.7 (HH) 0.3 - 1.2 mg/dL    Comment: CRITICAL RESULT CALLED TO, READ BACK BY AND VERIFIED WITH: K PATE,RN 1644 06/17/15 D BRADLEY    GFR calc non Af Amer 57 (L) >60 mL/min   GFR calc Af Amer >60 >60 mL/min    Comment: (NOTE) The eGFR has been calculated using the CKD EPI equation. This calculation has not been validated in all clinical situations. eGFR's persistently <60 mL/min signify possible Chronic Kidney Disease.    Anion gap 12 5 - 15  CBC     Status: Abnormal   Collection Time: 06/17/15  3:40 PM  Result Value Ref Range   WBC 17.6 (H) 4.0 - 10.5 K/uL   RBC 3.40 (L) 4.22 - 5.81 MIL/uL   Hemoglobin 12.1 (L) 13.0 - 17.0 g/dL   HCT 34.5 (L) 39.0 - 52.0 %   MCV 101.5 (H) 78.0 - 100.0 fL   MCH 35.6 (H) 26.0 - 34.0 pg   MCHC 35.1 30.0 - 36.0 g/dL   RDW 13.9 11.5 - 15.5 %   Platelets 277 150 - 400 K/uL  Urinalysis, Routine w reflex microscopic     Status: Abnormal   Collection Time: 06/17/15  3:45 PM  Result Value Ref Range   Color, Urine AMBER (A) YELLOW    Comment: BIOCHEMICALS MAY BE AFFECTED BY COLOR   APPearance CLOUDY (A) CLEAR   Specific Gravity, Urine 1.018 1.005 - 1.030   pH 5.5 5.0 - 8.0   Glucose, UA NEGATIVE NEGATIVE mg/dL   Hgb urine dipstick NEGATIVE NEGATIVE   Bilirubin Urine LARGE (A) NEGATIVE   Ketones, ur 15 (A) NEGATIVE mg/dL   Protein, ur NEGATIVE NEGATIVE mg/dL   Nitrite POSITIVE (A) NEGATIVE   Leukocytes, UA SMALL (A) NEGATIVE  Urine microscopic-add on     Status: Abnormal   Collection Time: 06/17/15   3:45 PM  Result Value Ref Range   Squamous Epithelial / LPF 0-5 (A) NONE SEEN   WBC, UA 0-5 0 - 5 WBC/hpf   RBC / HPF NONE SEEN 0 - 5 RBC/hpf   Bacteria, UA FEW (  A) NONE SEEN   Casts HYALINE CASTS (A) NEGATIVE    Comment: GRANULAR CAST WBC CAST EPITHELIAL CASTS    Urine-Other MUCOUS PRESENT     Comment: AMORPHOUS URATES/PHOSPHATES  Protime-INR     Status: Abnormal   Collection Time: 06/17/15  9:26 PM  Result Value Ref Range   Prothrombin Time 20.2 (H) 11.6 - 15.2 seconds   INR 1.73 (H) 0.00 - 1.49  Ethanol     Status: None   Collection Time: 06/17/15  9:26 PM  Result Value Ref Range   Alcohol, Ethyl (B) <5 <5 mg/dL    Comment:        LOWEST DETECTABLE LIMIT FOR SERUM ALCOHOL IS 5 mg/dL FOR MEDICAL PURPOSES ONLY   Ammonia     Status: Abnormal   Collection Time: 06/17/15  9:26 PM  Result Value Ref Range   Ammonia 55 (H) 9 - 35 umol/L  Bilirubin, fractionated(tot/dir/indir)     Status: Abnormal   Collection Time: 06/17/15  9:26 PM  Result Value Ref Range   Total Bilirubin 21.9 (HH) 0.3 - 1.2 mg/dL    Comment: CRITICAL RESULT CALLED TO, READ BACK BY AND VERIFIED WITH: FIELDS,K RN 06/17/2015 2249 JORDANS    Bilirubin, Direct 14.4 (H) 0.1 - 0.5 mg/dL    Comment: RESULTS CONFIRMED BY MANUAL DILUTION   Indirect Bilirubin 7.5 (H) 0.3 - 0.9 mg/dL  Vitamin B12     Status: Abnormal   Collection Time: 06/18/15 12:10 AM  Result Value Ref Range   Vitamin B-12 1147 (H) 180 - 914 pg/mL    Comment: (NOTE) This assay is not validated for testing neonatal or myeloproliferative syndrome specimens for Vitamin B12 levels.   Folate     Status: Abnormal   Collection Time: 06/18/15 12:10 AM  Result Value Ref Range   Folate 4.6 (L) >5.9 ng/mL  Iron and TIBC     Status: Abnormal   Collection Time: 06/18/15 12:10 AM  Result Value Ref Range   Iron 31 (L) 45 - 182 ug/dL   TIBC 176 (L) 250 - 450 ug/dL   Saturation Ratios 18 17.9 - 39.5 %   UIBC 145 ug/dL  Ferritin     Status: None    Collection Time: 06/18/15 12:10 AM  Result Value Ref Range   Ferritin 137 24 - 336 ng/mL  Reticulocytes     Status: Abnormal   Collection Time: 06/18/15 12:10 AM  Result Value Ref Range   Retic Ct Pct 1.5 0.4 - 3.1 %   RBC. 3.21 (L) 4.22 - 5.81 MIL/uL   Retic Count, Manual 48.2 19.0 - 186.0 K/uL  Sodium, urine, random     Status: None   Collection Time: 06/18/15  6:11 AM  Result Value Ref Range   Sodium, Ur <10 mmol/L    Comment: REPEATED TO VERIFY  Osmolality, urine     Status: None   Collection Time: 06/18/15  6:11 AM  Result Value Ref Range   Osmolality, Ur 392 300 - 900 mOsm/kg  Hepatic function panel     Status: Abnormal   Collection Time: 06/18/15  6:44 AM  Result Value Ref Range   Total Protein 6.5 6.5 - 8.1 g/dL   Albumin 1.8 (L) 3.5 - 5.0 g/dL   AST 201 (H) 15 - 41 U/L   ALT 49 17 - 63 U/L   Alkaline Phosphatase 219 (H) 38 - 126 U/L   Total Bilirubin 21.6 (HH) 0.3 - 1.2 mg/dL    Comment: CRITICAL RESULT  CALLED TO, READ BACK BY AND VERIFIED WITH: RN [facility] AT [id number] MARTINB    Bilirubin, Direct 14.4 (H) 0.1 - 0.5 mg/dL    Comment: RESULTS CONFIRMED BY MANUAL DILUTION   Indirect Bilirubin 7.2 (H) 0.3 - 0.9 mg/dL  CBC with Differential/Platelet     Status: Abnormal   Collection Time: 06/18/15  6:44 AM  Result Value Ref Range   WBC 15.8 (H) 4.0 - 10.5 K/uL   RBC 3.37 (L) 4.22 - 5.81 MIL/uL   Hemoglobin 12.0 (L) 13.0 - 17.0 g/dL   HCT 34.3 (L) 39.0 - 52.0 %   MCV 101.8 (H) 78.0 - 100.0 fL   MCH 35.6 (H) 26.0 - 34.0 pg   MCHC 35.0 30.0 - 36.0 g/dL   RDW 14.0 11.5 - 15.5 %   Platelets 274 150 - 400 K/uL   Neutrophils Relative % 86 %   Neutro Abs 13.5 (H) 1.7 - 7.7 K/uL   Lymphocytes Relative 7 %   Lymphs Abs 1.1 0.7 - 4.0 K/uL   Monocytes Relative 7 %   Monocytes Absolute 1.1 (H) 0.1 - 1.0 K/uL   Eosinophils Relative 0 %   Eosinophils Absolute 0.0 0.0 - 0.7 K/uL   Basophils Relative 0 %   Basophils Absolute 0.0 0.0 - 0.1 K/uL  Basic metabolic panel      Status: Abnormal   Collection Time: 06/18/15  6:44 AM  Result Value Ref Range   Sodium 124 (L) 135 - 145 mmol/L   Potassium 2.9 (L) 3.5 - 5.1 mmol/L   Chloride 88 (L) 101 - 111 mmol/L   CO2 23 22 - 32 mmol/L   Glucose, Bld 106 (H) 65 - 99 mg/dL   BUN 23 (H) 6 - 20 mg/dL   Creatinine, Ser 1.58 (H) 0.61 - 1.24 mg/dL   Calcium 8.0 (L) 8.9 - 10.3 mg/dL   GFR calc non Af Amer 51 (L) >60 mL/min   GFR calc Af Amer 59 (L) >60 mL/min    Comment: (NOTE) The eGFR has been calculated using the CKD EPI equation. This calculation has not been validated in all clinical situations. eGFR's persistently <60 mL/min signify possible Chronic Kidney Disease.    Anion gap 13 5 - 15  Protime-INR     Status: Abnormal   Collection Time: 06/18/15  6:44 AM  Result Value Ref Range   Prothrombin Time 21.4 (H) 11.6 - 15.2 seconds   INR 1.86 (H) 0.00 - 1.49  Albumin, pleural or peritoneal fluid     Status: None   Collection Time: 06/18/15  9:33 AM  Result Value Ref Range   Albumin, Fluid <1.0 g/dL    Comment: REPEATED TO VERIFY (NOTE) No normal range established for this test Results should be evaluated in conjunction with serum values    Fluid Type-FALB Peritoneal   Protein, pleural or peritoneal fluid     Status: None   Collection Time: 06/18/15  9:33 AM  Result Value Ref Range   Total protein, fluid <3.0 g/dL    Comment: (NOTE) No normal range established for this test Results should be evaluated in conjunction with serum values    Fluid Type-FTP Peritoneal   Body fluid cell count with differential     Status: Abnormal   Collection Time: 06/18/15  9:33 AM  Result Value Ref Range   Fluid Type-FCT Peritoneal    Color, Fluid YELLOW YELLOW   Appearance, Fluid CLEAR (A) CLEAR   WBC, Fluid 92 0 - 1000 cu mm  Neutrophil Count, Fluid 15 0 - 25 %   Lymphs, Fluid 49 %   Monocyte-Macrophage-Serous Fluid 36 (L) 50 - 90 %   Eos, Fluid 0 %   Other Cells, Fluid MESOTHELIAL CELLS %  Gram stain      Status: None   Collection Time: 06/18/15  9:33 AM  Result Value Ref Range   Specimen Description FLUID PERITONEAL    Special Requests NONE    Gram Stain      FEW WBC PRESENT, PREDOMINANTLY MONONUCLEAR NO ORGANISMS SEEN    Report Status 06/18/2015 FINAL   Glucose, pleural or peritoneal fluid     Status: None   Collection Time: 06/18/15  9:33 AM  Result Value Ref Range   Glucose, Fluid 110 mg/dL    Comment: (NOTE) No normal range established for this test Results should be evaluated in conjunction with serum values    Fluid Type-FGLU Peritoneal     Dg Chest 2 View  06/17/2015  CLINICAL DATA:  Shortness of breath EXAM: CHEST  2 VIEW COMPARISON:  07/13/2014 chest radiograph. FINDINGS: Stable cardiomediastinal silhouette with normal heart size. No pneumothorax. No pleural effusion. Minimal scarring versus atelectasis at the anterior left lung base. No pulmonary edema. No acute consolidative airspace disease. IMPRESSION: Minimal scarring versus atelectasis at the anterior left lung base. Otherwise no active disease in the chest. Electronically Signed   By: Ilona Sorrel M.D.   On: 06/17/2015 19:29   US Abdomen Complete  06/17/2015  CLINICAL DATA:  Abdominal pain and distention.  Alcohol abuse. EXAM: ABDOMEN ULTRASOUND COMPLETE COMPARISON:  None. FINDINGS: Gallbladder: Nondistended gallbladder contains several tiny echogenic non shadowing foci in the dependent gallbladder measuring up to 5 mm in size, probably sludge balls or tiny non shadowing gallstones. Mild diffuse gallbladder wall thickening. No pericholecystic fluid or sonographic Murphy sign. Common bile duct: Diameter: 4 mm Liver: Liver parenchyma is diffusely coarsened and echogenic, there is posterior acoustic attenuation in the liver and the liver surface appears slightly irregular, suggesting cirrhosis in superimposed steatosis. No liver mass detected, noting decreased sensitivity in the setting of an echogenic liver. IVC: No abnormality  visualized. Pancreas: Poorly visualized due to overlying bowel gas. Spleen: Size and appearance within normal limits more (craniocaudal splenic length 12.1 cm). Right Kidney: Length: 11.4 cm. Echogenicity within normal limits. No mass or hydronephrosis visualized. Left Kidney: Length: 10.3 cm. Echogenicity within normal limits. No mass or hydronephrosis visualized. Abdominal aorta: No aneurysm visualized. Other findings: Moderate volume ascites. IMPRESSION: 1. Sonographic findings are suggestive of cirrhosis and superimposed hepatic steatosis. No liver mass detected. 2. Moderate volume ascites.  Top-normal size spleen. 3. Tiny sludge balls versus non shadowing gallstones in the nondistended gallbladder. Mild diffuse gallbladder wall thickening without sonographic Murphy's sign, probably reactive or noninflammatory gallbladder wall edema. Electronically Signed   By: Ilona Sorrel M.D.   On: 06/17/2015 20:28   US Paracentesis  06/18/2015  INDICATION: Jaundice; ascites EXAM: ULTRASOUND-GUIDED PARACENTESIS COMPARISON:  None. MEDICATIONS: 10 cc 1% lidocaine COMPLICATIONS: None immediate. TECHNIQUE: Informed written consent was obtained from the patient after a discussion of the risks, benefits and alternatives to treatment. A timeout was performed prior to the initiation of the procedure. Initial ultrasound scanning demonstrates a large amount of ascites within the right lower abdominal quadrant. The right lower abdomen was prepped and draped in the usual sterile fashion. 1% lidocaine with epinephrine was used for local anesthesia. Under direct ultrasound guidance, a 19 gauge, 7-cm, Yueh catheter was introduced. An ultrasound image was  saved for documentation purposed. The paracentesis was performed. The catheter was removed and a dressing was applied. The patient tolerated the procedure well without immediate post procedural complication. FINDINGS: A total of approximately 3 liters of dark yellow fluid was removed.  Samples were sent to the laboratory as requested by the clinical team. IMPRESSION: Successful ultrasound-guided paracentesis yielding 3 liters of peritoneal fluid. Read by:  Lavonia Drafts [facility] Electronically Signed   By: Sandi Mariscal M.D.   On: 06/18/2015 09:50               Blood pressure 117/79, pulse 95, temperature 98.3 F (36.8 C), temperature source Oral, resp. rate 18, height 5' 9"  (1.753 m), weight 70.8 kg (156 lb 1.4 oz), SpO2 97 %.  Physical exam:   General-- markedly jaundice white male   Heart-- regular rate and rhythm without murmurs gallops Lungs-- clear Abdomen-- still ascites but nontender and good bowel sounds. Liver palpable several centimeters below the costal margin and nontender Psych-- alert and oriented   Assessment: 1. Severe alcoholic hepatitis. Maddrey discriminant function 65. A value greater than 32 would warrant treatment with prednisolone. I would also add Trental since it has offered improvement in some studies. Based on his discriminant function is anticipated 3 month mortality is around 20 to 30%.  Plan: 1. We will draw labs to rule out other causes of acute hepatitis although this is almost certainly alcohol related. 2. Discussion with the patient and his sister that he is seriously ill and that in spite of the fact that he is gone a week or so without drinking he would not be eligible for liver transplantation until he has gone over 6 months without any alcohol. 3. I will go ahead and start prednisolone and Trental   Galya Dunnigan JR,Joselle Deeds L 06/18/2015, 2:45 PM   This note was created using voice recognition software and minor errors may Have occurred unintentionally. Pager: [phone] If no answer or after hours call [phone]

## 2015-06-18 NOTE — Progress Notes (Signed)
Pt Para Michael [name], admitted to the floor via stretcher from the ED, accompanied by his fiance.  Alert and oriented, he comes with a complaint of back pain, nausea and vomiting, and being jaundiced with ascities.  His feet and ankles are swollen, which he states "they get swollen, but not this bad" Feet elevated on pillows, scds on for VTE. He is on a 2 gm Na diet with fluid restriction of 1200 cc. He is aware of this. Oriented to the room and  Unit.  Dorothe PeaKaren Jadarion Halbig, RN

## 2015-06-19 LAB — COMPREHENSIVE METABOLIC PANEL
ALT: 43 U/L (ref 17–63)
AST: 171 U/L — AB (ref 15–41)
Albumin: 1.7 g/dL — ABNORMAL LOW (ref 3.5–5.0)
Alkaline Phosphatase: 204 U/L — ABNORMAL HIGH (ref 38–126)
Anion gap: 12 (ref 5–15)
BILIRUBIN TOTAL: 22.2 mg/dL — AB (ref 0.3–1.2)
BUN: 25 mg/dL — AB (ref 6–20)
CHLORIDE: 89 mmol/L — AB (ref 101–111)
CO2: 22 mmol/L (ref 22–32)
CREATININE: 1.29 mg/dL — AB (ref 0.61–1.24)
Calcium: 8.1 mg/dL — ABNORMAL LOW (ref 8.9–10.3)
GFR calc Af Amer: >60 mL/min (ref >60)
GLUCOSE: 115 mg/dL — AB (ref 65–99)
Potassium: 3.3 mmol/L — ABNORMAL LOW (ref 3.5–5.1)
Sodium: 123 mmol/L — ABNORMAL LOW (ref 135–145)
Total Protein: 6.1 g/dL — ABNORMAL LOW (ref 6.5–8.1)

## 2015-06-19 LAB — URINE CULTURE: Culture: NO GROWTH

## 2015-06-19 LAB — CBC
HEMATOCRIT: 33.8 % — AB (ref 39.0–52.0)
Hemoglobin: 11.8 g/dL — ABNORMAL LOW (ref 13.0–17.0)
MCH: 35.4 pg — ABNORMAL HIGH (ref 26.0–34.0)
MCHC: 34.9 g/dL (ref 30.0–36.0)
MCV: 101.5 fL — AB (ref 78.0–100.0)
Platelets: 268 10*3/uL (ref 150–400)
RBC: 3.33 MIL/uL — AB (ref 4.22–5.81)
RDW: 13.8 % (ref 11.5–15.5)
WBC: 16 10*3/uL — AB (ref 4.0–10.5)

## 2015-06-19 LAB — HEPATITIS PANEL, ACUTE
HEP B C IGM: NEGATIVE
HEP B S AG: NEGATIVE
Hep A IgM: NEGATIVE

## 2015-06-19 LAB — PROTIME-INR
INR: 1.61 — ABNORMAL HIGH (ref 0.00–1.49)
PROTHROMBIN TIME: 19.2 s — AB (ref 11.6–15.2)

## 2015-06-19 LAB — GLUCOSE, CAPILLARY: GLUCOSE-CAPILLARY: 135 mg/dL — AB (ref 65–99)

## 2015-06-19 MED ORDER — POLYETHYLENE GLYCOL 3350 17 G PO PACK
17.0000 g | PACK | Freq: Every day | ORAL | Status: DC
  Administered 2015-06-19 – 2015-06-21 (×3): 17 g via ORAL
  Filled 2015-06-19 (×4): qty 1

## 2015-06-19 MED ORDER — POTASSIUM CHLORIDE CRYS ER 20 MEQ PO TBCR
40.0000 meq | EXTENDED_RELEASE_TABLET | Freq: Once | ORAL | Status: AC
  Administered 2015-06-19: 40 meq via ORAL
  Filled 2015-06-19: qty 2

## 2015-06-19 MED ORDER — DOCUSATE SODIUM 100 MG PO CAPS
100.0000 mg | ORAL_CAPSULE | Freq: Two times a day (BID) | ORAL | Status: DC
  Administered 2015-06-19 – 2015-06-21 (×2): 100 mg via ORAL
  Filled 2015-06-19 (×5): qty 1

## 2015-06-19 NOTE — Progress Notes (Signed)
PROGRESS NOTE    Michael [name]  ZOX:[id number] DOB: 1970-05-01 DOA: 06/17/2015 PCP: No PCP Per Patient   Brief Narrative:  On 06/17/2015 by Dr. [name] Michael [name] is a 45 y.o. male with medical history significant of alcoholism presents to the ER because of increasing jaundice and abdominal distention. Patient states over the last 3 weeks patient was noticing increasing abdominal distention and lower extremity edema and family noticed patient having jaundice. Patient denies any nausea vomiting abdominal pain fever chills chest pain or shortness of breath. In the ER patient's total bilirubin is 23 and a sonogram of the abdomen shows moderate ascites and LFTs elevated. Patient states he drinks alcohol since age 28. Since there was some concern for gallbladder pathology general surgery was consulted.   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 Patient would not be eligible for liver transplant until he is 6 month sober (without alcohol) -MELD score 28, Maddrey discriminant function 65 -Holding diuretics due to acute kidney injury -Hepatitis panel nonreactive   Coagulopathy -Secondary to the above, INR 1.61 -Vitamin K has been ordered -Continue to monitor INR  Macrocytic anemia -Anemia panel: Iron 31, ferritin 137, TIBC 176 -Vitamin D [birth year]1946 -Hemoglobin currently 12, continue to monitor CBC  Acute kidney injury -Creatinine 2016 0.99, rose to 1.58, currently 1.29 -Holding IV fluids due to volume overload -Continue to monitor CMP  Hyponatremia -Likely secondary to volume overload -Urine osmolality 392, urine sodium less than 10 -Sodium 124 -Continue to monitor CMP  Alcohol and tobacco abuse -Discussed  cessation -Continue CIWA protocol  Gallbladder sludge/thickening -Noted on [place] -General surgery consulted - could order HIDA to assess for cholecystitis but tracer may not be taken up  DVT Prophylaxis  SCDs  Code Status: Full  Family Communication: Mother at bedside  Disposition Plan: Admitted. Pending workup  Consultants Gastroenterology   Procedures  Abdominal US Paracentesis  Antibiotics   Anti-infectives    Start     Dose/Rate Route Frequency Ordered Stop   06/18/15 0000  cefTRIAXone (ROCEPHIN) 2 g in dextrose 5 % 50 mL IVPB     2 g 100 mL/hr over 30 Minutes Intravenous Every 24 hours 06/17/15 2341        Subjective:   Michael [name] seen and examined today.   Patient feels that his abdomen is less tense than yesterday, but feels that he may be accumulating more fluid as compared to last night.  Wonders if he needs another paracentesis.  Feels his leg swelling has decreased. Denies nausea or vomiting,  chest pain or shortness of breath.  Objective:   Filed Vitals:   06/18/15 1756 06/18/15 2115 06/19/15 0140 06/19/15 0529  BP: 119/73 116/70 115/74 117/79  Pulse: 100 96 92 89  Temp: 98.8 F (37.1 C) 98.1 F (36.7 C) 97.7 F (36.5 C) 98 F (36.7 C)  TempSrc: Oral Oral Oral Oral  Resp: 18 18 15 20   Height:      Weight:    69.9 kg (154 lb 1.6 oz)  SpO2: 97% 95% 95% 96%    Intake/Output Summary (Last 24 hours) at 06/19/15 1137 Last data filed at 06/19/15 1114  Gross per 24 hour  Intake    940 ml  Output   1150 ml  Net   -210 ml   [employer]  06/17/15 1538 06/18/15 0700 06/19/15 0529  Weight: 72.984 kg (160 lb 14.4 oz) 70.8 kg (156 lb 1.4 oz) 69.9 kg (154 lb 1.6 oz)    Exam  General: Well developed, well nourished, NAD  HEENT: NCAT, Sclera icterus, mucous membranes moist.   Cardiovascular: S1 S2 auscultated, no murmurs, RRR  Respiratory: Clear to auscultation bilaterally  Abdomen: Soft, nontender, +distended, + bowel sounds  Extremities: warm  dry without cyanosis clubbing. Right foot edema.  Neuro: AAOx3, nonfocal  Skin: Without rashes exudates or nodules, jaundice  Psych: Appropriate mood and affect   Data Reviewed: I have personally reviewed following labs and imaging studies  CBC:  Recent Labs Lab 06/17/15 1540 06/18/15 0644 06/19/15 0600  WBC 17.6* 15.8* 16.0*  NEUTROABS  --  13.5*  --   HGB 12.1* 12.0* 11.8*  HCT 34.5* 34.3* 33.8*  MCV 101.5* 101.8* 101.5*  PLT 277 274 268   Basic Metabolic Panel:  Recent Labs Lab 06/17/15 1540 06/18/15 0644 06/19/15 0600  NA 123* 124* 123*  K 3.3* 2.9* 3.3*  CL 88* 88* 89*  CO2 23 23 22   GLUCOSE 104* 106* 115*  BUN 22* 23* 25*  CREATININE 1.43* 1.58* 1.29*  CALCIUM 8.2* 8.0* 8.1*   GFR: Estimated Creatinine Clearance: 70.7 mL/min (by C-G formula based on Cr of 1.29). Liver Function Tests:  Recent Labs Lab 06/17/15 1540 06/17/15 2126 06/18/15 0644 06/19/15 0600  AST 222*  --  201* 171*  ALT 58  --  49 43  ALKPHOS 237*  --  219* 204*  BILITOT 23.7* 21.9* 21.6* 22.2*  PROT 6.8  --  6.5 6.1*  ALBUMIN 2.0*  --  1.8* 1.7*    Recent Labs Lab 06/17/15 1540  LIPASE 69*    Recent Labs Lab 06/17/15 2126  AMMONIA 55*   Coagulation Profile:  Recent Labs Lab 06/17/15 2126 06/18/15 0644 06/19/15 0600  INR 1.73* 1.86* 1.61*   Cardiac Enzymes: No results for input(s): CKTOTAL, CKMB, CKMBINDEX, TROPONINI in the last 168 hours. BNP (last 3 results) No results for input(s): PROBNP in the last 8760 hours. HbA1C: No results for input(s): HGBA1C in the last 72 hours. CBG: No results for input(s): GLUCAP in the last 168 hours. Lipid Profile: No results for input(s): CHOL, HDL, LDLCALC, TRIG, CHOLHDL, LDLDIRECT in the last 72 hours. Thyroid Function Tests: No results for input(s): TSH, T4TOTAL, FREET4, T3FREE, THYROIDAB in the last 72 hours. Anemia Panel:  Recent Labs  06/18/15 0010  VITAMINB12 1147*  FOLATE 4.6*  FERRITIN 137  TIBC 176*    IRON 31*  RETICCTPCT 1.5   Urine analysis:    Component Value Date/Time   COLORURINE AMBER* 06/17/2015 1545   APPEARANCEUR CLOUDY* 06/17/2015 1545   LABSPEC 1.018 06/17/2015 1545   PHURINE 5.5 06/17/2015 1545   GLUCOSEU NEGATIVE 06/17/2015 1545   HGBUR NEGATIVE 06/17/2015 1545   BILIRUBINUR LARGE* 06/17/2015 1545   KETONESUR 15* 06/17/2015 1545   PROTEINUR NEGATIVE 06/17/2015 1545   NITRITE POSITIVE* 06/17/2015 1545   LEUKOCYTESUR SMALL* 06/17/2015 1545   Sepsis Labs: @LABRCNTIP (procalcitonin:4,lacticidven:4)  ) Recent Results (from the past 240 hour(s))  Culture, Urine     Status: None   Collection Time: 06/18/15  6:10 AM  Result Value Ref Range Status   Specimen Description URINE, RANDOM  Final   Special Requests NONE  Final   Culture NO GROWTH  Final   Report Status 06/19/2015 FINAL  Final  Gram stain     Status: None   Collection Time:  06/18/15  9:33 AM  Result Value Ref Range Status   Specimen Description FLUID PERITONEAL  Final   Special Requests NONE  Final   Gram Stain   Final    FEW WBC PRESENT, PREDOMINANTLY MONONUCLEAR NO ORGANISMS SEEN    Report Status 06/18/2015 FINAL  Final      Radiology Studies: Dg Chest 2 View  06/17/2015  CLINICAL DATA:  Shortness of breath EXAM: CHEST  2 VIEW COMPARISON:  07/13/2014 chest radiograph. FINDINGS: Stable cardiomediastinal silhouette with normal heart size. No pneumothorax. No pleural effusion. Minimal scarring versus atelectasis at the anterior left lung base. No pulmonary edema. No acute consolidative airspace disease. IMPRESSION: Minimal scarring versus atelectasis at the anterior left lung base. Otherwise no active disease in the chest. Electronically Signed   By: Delbert PhenixJason A Poff M.D.   On: 06/17/2015 19:29   [place] Abdomen Complete  06/17/2015  CLINICAL DATA:  Abdominal pain and distention.  Alcohol abuse. EXAM: ABDOMEN ULTRASOUND COMPLETE COMPARISON:  None. FINDINGS: Gallbladder: Nondistended gallbladder contains several  tiny echogenic non shadowing foci in the dependent gallbladder measuring up to 5 mm in size, probably sludge balls or tiny non shadowing gallstones. Mild diffuse gallbladder wall thickening. No pericholecystic fluid or sonographic Murphy sign. Common bile duct: Diameter: 4 mm Liver: Liver parenchyma is diffusely coarsened and echogenic, there is posterior acoustic attenuation in the liver and the liver surface appears slightly irregular, suggesting cirrhosis in superimposed steatosis. No liver mass detected, noting decreased sensitivity in the setting of an echogenic liver. IVC: No abnormality visualized. Pancreas: Poorly visualized due to overlying bowel gas. Spleen: Size and appearance within normal limits more (craniocaudal splenic length 12.1 cm). Right Kidney: Length: 11.4 cm. Echogenicity within normal limits. No mass or hydronephrosis visualized. Left Kidney: Length: 10.3 cm. Echogenicity within normal limits. No mass or hydronephrosis visualized. Abdominal aorta: No aneurysm visualized. Other findings: Moderate volume ascites. IMPRESSION: 1. Sonographic findings are suggestive of cirrhosis and superimposed hepatic steatosis. No liver mass detected. 2. Moderate volume ascites.  Top-normal size spleen. 3. Tiny sludge balls versus non shadowing gallstones in the nondistended gallbladder. Mild diffuse gallbladder wall thickening without sonographic Murphy's sign, probably reactive or noninflammatory gallbladder wall edema. Electronically Signed   By: Delbert PhenixJason A Poff M.D.   On: 06/17/2015 20:28   [place] Paracentesis  06/18/2015  INDICATION: Jaundice; ascites EXAM: ULTRASOUND-GUIDED PARACENTESIS COMPARISON:  None. MEDICATIONS: 10 cc 1% lidocaine COMPLICATIONS: None immediate. TECHNIQUE: Informed written consent was obtained from the patient after a discussion of the risks, benefits and alternatives to treatment. A timeout was performed prior to the initiation of the procedure. Initial ultrasound scanning demonstrates  a large amount of ascites within the right lower abdominal quadrant. The right lower abdomen was prepped and draped in the usual sterile fashion. 1% lidocaine with epinephrine was used for local anesthesia. Under direct ultrasound guidance, a 19 gauge, 7-cm, Yueh catheter was introduced. An ultrasound image was saved for documentation purposed. The paracentesis was performed. The catheter was removed and a dressing was applied. The patient tolerated the procedure well without immediate post procedural complication. FINDINGS: A total of approximately 3 liters of dark yellow fluid was removed. Samples were sent to the laboratory as requested by the clinical team. IMPRESSION: Successful ultrasound-guided paracentesis yielding 3 liters of peritoneal fluid. Read by:  Robet LeuPamela A Turpin [facility] Electronically Signed   By: Simonne ComeJohn  Watts M.D.   On: 06/18/2015 09:50     Scheduled Meds: . bacitracin   Topical BID  . cefTRIAXone (ROCEPHIN)  IV  2 g Intravenous Q24H  . folic acid  1 mg Oral Daily  . hydrocortisone cream   Topical BID  . LORazepam  0-4 mg Oral Q6H   Followed by  . [START ON 06/20/2015] LORazepam  0-4 mg Oral Q12H  . multivitamin with minerals  1 tablet Oral Daily  . pentoxifylline  400 mg Oral TID WC  . prednisoLONE  10 mg Oral BID  . sodium chloride flush  3 mL Intravenous Q12H  . thiamine  100 mg Oral Daily   Or  . thiamine  100 mg Intravenous Daily   Continuous Infusions:    LOS: 2 days   Time Spent in minutes   30 minutes  Joyanne Eddinger D.O. on 06/19/2015 at 11:37 AM  Between 7am to 7pm - Pager - [phone]  After 7pm go to [email or web address] - password [email or web address]  And look for the night coverage person covering for me after hours  [facility] Office  [phone]

## 2015-06-19 NOTE — Plan of Care (Signed)
Problem: Physical Regulation: Goal: Ability to maintain clinical measurements within normal limits will improve Outcome: Progressing Pt. Ambulated in halls up to nursing station, around circle and back to room

## 2015-06-20 LAB — COMPREHENSIVE METABOLIC PANEL
ALT: 44 U/L (ref 17–63)
AST: 148 U/L — ABNORMAL HIGH (ref 15–41)
Albumin: 1.8 g/dL — ABNORMAL LOW (ref 3.5–5.0)
Alkaline Phosphatase: 200 U/L — ABNORMAL HIGH (ref 38–126)
Anion gap: 11 (ref 5–15)
BUN: 24 mg/dL — AB (ref 6–20)
CHLORIDE: 94 mmol/L — AB (ref 101–111)
CO2: 20 mmol/L — AB (ref 22–32)
Calcium: 8.2 mg/dL — ABNORMAL LOW (ref 8.9–10.3)
Creatinine, Ser: 1.18 mg/dL (ref 0.61–1.24)
GFR calc Af Amer: >60 mL/min (ref >60)
Glucose, Bld: 103 mg/dL — ABNORMAL HIGH (ref 65–99)
POTASSIUM: 3.6 mmol/L (ref 3.5–5.1)
SODIUM: 125 mmol/L — AB (ref 135–145)
Total Bilirubin: 21.5 mg/dL (ref 0.3–1.2)
Total Protein: 6.1 g/dL — ABNORMAL LOW (ref 6.5–8.1)

## 2015-06-20 LAB — CBC
HCT: 34.8 % — ABNORMAL LOW (ref 39.0–52.0)
Hemoglobin: 11.9 g/dL — ABNORMAL LOW (ref 13.0–17.0)
MCH: 35.6 pg — AB (ref 26.0–34.0)
MCHC: 34.2 g/dL (ref 30.0–36.0)
MCV: 104.2 fL — ABNORMAL HIGH (ref 78.0–100.0)
PLATELETS: 257 10*3/uL (ref 150–400)
RBC: 3.34 MIL/uL — ABNORMAL LOW (ref 4.22–5.81)
RDW: 13.8 % (ref 11.5–15.5)
WBC: 19.5 10*3/uL — ABNORMAL HIGH (ref 4.0–10.5)

## 2015-06-20 LAB — HEPATITIS C ANTIBODY: HCV AB: 0.1 {s_co_ratio} (ref 0.0–0.9)

## 2015-06-20 LAB — HEPATITIS A ANTIBODY, TOTAL: Hep A Total Ab: POSITIVE — AB

## 2015-06-20 LAB — HEPATITIS B SURFACE ANTIGEN: Hepatitis B Surface Ag: NEGATIVE

## 2015-06-20 MED ORDER — PREDNISOLONE 5 MG PO TABS
40.0000 mg | ORAL_TABLET | Freq: Every day | ORAL | Status: DC
  Administered 2015-06-21 – 2015-06-23 (×3): 40 mg via ORAL
  Filled 2015-06-20 (×3): qty 8

## 2015-06-20 MED ORDER — PANTOPRAZOLE SODIUM 40 MG PO TBEC
40.0000 mg | DELAYED_RELEASE_TABLET | Freq: Every day | ORAL | Status: DC
  Administered 2015-06-20 – 2015-06-23 (×4): 40 mg via ORAL
  Filled 2015-06-20 (×4): qty 1

## 2015-06-20 NOTE — Progress Notes (Signed)
Patient's chief complaint is "bloating" in the abdomen related to ascites. He is otherwise in no distress.  On exam, he is deeply jaundiced but completely coherent, no asterixis, does serial twos quickly and accurately. Chest is clear, no evidence of pleural effusion. Heart is without murmur or arrhythmia, although rate is slightly rapid. Abdomen is fairly tense, flank dullness and protuberance consistent with ascites, no tenderness, no palpable organomegaly. No peripheral edema at this time.  Labs show minimal improvement in his bilirubin, progressive improvement in his creatinine, INR not repeated today.  Patient is currently on prednisolone 10 mg twice a day as well as pentoxifylline.  Recommendations:  1. Stop pentoxifylline since it does not appear to have additive benefit to glucocorticoid therapy, according to my review of management of alcoholic hepatitis in Up-to-Date.  2. Dose of pentoxifylline has been adjusted to bring it in line with the recommendations and up-to-date, namely 40 mg daily for 4 weeks, followed by a 2 week taper.  3. Follow-up labs to be obtained tomorrow to continue to monitor the patient's clinical course.  4. Detailed discussion with patient, fiance [name] and mother at the bedside, concerning nature of alcoholic hepatitis as distinguished from cirrhosis, prognosis, management, importance of alcohol cessation, risk factors for recidivism, management of fluid retention, risks of paracentesis and overdiuresis, importance of sodium restriction.   5. The patient seems to have good insight into the fact that alcohol is a problem for him, and he clearly states his intent to stop drinking. At this time, he declines social work input concerning alcohol cessation resources such as AA or rehabilitation programs.  6. Dietitian consult to assist with sodium restriction in his diet following discharge. General principles discussed with the patient today.   Florencia Reasonsobert V. Jisella Ashenfelter,  M.D. Pager [phone] If no answer or after 5 PM call [phone]

## 2015-06-20 NOTE — Care Management Note (Signed)
Case Management Note  Patient Details  Name: Michael [name] MRN: [id number] Date of Birth: 25-Sep-1970  Subjective/Objective:                 Spoke with patient and his mom in the room. Patient lives at home alone, girlfriend lives two houses down the street. He is [facility] at [facility] Adm for ascites/ cirrhosis. May repeat Michael prior to DC.   Action/Plan:  Schedule Follow up at [facility] closer to [place].   Expected Discharge Date:  06/20/15               Expected Discharge Plan:  Home/Self Care  In-House Referral:     Discharge planning Services  CM Consult, [facility]  Post Acute Care Choice:  NA Choice offered to:     DME Arranged:    DME Agency:     HH Arranged:    HH Agency:     Status of Service:  In process, will continue to follow  Medicare Important Message Given:    Date Medicare IM Given:    Medicare IM give by:    Date Additional Medicare IM Given:    Additional Medicare Important Message give by:     If discussed at Long Length of Stay Meetings, dates discussed:    Additional Comments:  Lawerance SabalDebbie Joliene Salvador, RN 06/20/2015, 4:01 PM

## 2015-06-20 NOTE — Progress Notes (Signed)
PROGRESS NOTE    Michael [name]  DGU:[id number] DOB: December 29, 1970 DOA: 06/17/2015 PCP: No PCP Per Patient   Brief Narrative:  On 06/17/2015 by Dr. [name] Michael [name] is a 45 y.o. male with medical history significant of alcoholism presents to the ER because of increasing jaundice and abdominal distention. Patient states over the last 3 weeks patient was noticing increasing abdominal distention and lower extremity edema and family noticed patient having jaundice. Patient denies any nausea vomiting abdominal pain fever chills chest pain or shortness of breath. In the ER patient's total bilirubin is 23 and a sonogram of the abdomen shows moderate ascites and LFTs elevated. Patient states he drinks alcohol since age 61. Since there was some concern for gallbladder pathology general surgery was consulted.   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 Patient would not be eligible for liver transplant until he is 6 month sober (without alcohol) -MELD score 28, Maddrey discriminant function 65 -Holding diuretics due to acute kidney injury -Hepatitis panel nonreactive  -GI currently recommends discontinuing steroids and continuing Trental with a taper:  daily for 4 weeks, followed by 2 week taper. Follow clinical course. Hold off on diuretis -Nutrition consulted to discuss low sodium diet and other modifications   Coagulopathy -Secondary to the above, INR 1.61 -Vitamin K has been ordered -Continue to monitor INR  Macrocytic anemia -Anemia panel: Iron 31, ferritin 137, TIBC 176 -Vitamin D [birth year]1946 -Hemoglobin currently 12, continue to monitor CBC  Acute kidney injury -Creatinine 2016 0.99, rose to 1.58, currently  1.18 -Holding IV fluids due to volume overload -Continue to monitor CMP  Hyponatremia -Likely secondary to volume overload -Urine osmolality 392, urine sodium less than 10 -Sodium 125 -Continue to monitor CMP  Alcohol and tobacco abuse -Discussed cessation -Continue CIWA protocol -Discussed support groups and AA, declined social work consult  Gallbladder sludge/thickening -Noted on [place] -General surgery consulted - could order HIDA to assess for cholecystitis but tracer may not be taken up  Hypokalemia -Resolved, continue to monitor CMP  Leukocytosis -Likely secondary to steroids -No source of infections, although patient is on ceftriaxone empirically -Continue to monitor CBC -Steroids discontinued today  DVT Prophylaxis  SCDs  Code Status: Full  Family Communication: Mother at bedside  Disposition Plan: Admitted. Continue to monitor  Consultants Gastroenterology  General surgery   Procedures  Abdominal US Paracentesis  Antibiotics   Anti-infectives    Start     Dose/Rate Route Frequency Ordered Stop   06/18/15 0000  cefTRIAXone (ROCEPHIN) 2 g in dextrose 5 % 50 mL IVPB     2 g 100 mL/hr over 30 Minutes Intravenous Every 24 hours 06/17/15 2341        Subjective:   Michael [name] seen and examined today.   Patient feels his leg swelling has resolved.  Continues to fill bloating and abdominal tightness. Denies nausea, vomiting, chest pain or shortness of breath.  Objective:   Filed Vitals:   06/19/15 2107 06/20/15 0023 06/20/15 0706 06/20/15 1226  BP: 123/81 121/79 117/79 120/79  Pulse: 95 90 90 98  Temp: 99.1 F (37.3 C) 98.4 F (36.9 C) 97.9 F (36.6 C) 99 F (37.2 C)  TempSrc: Oral Oral  Oral  Resp: Height:      Weight:  SpO2: 97% 96% 96% 99%    Intake/Output Summary (Last 24 hours) at 06/20/15 1302 Last data filed at 06/20/15 0730  Gross per 24 hour  Intake    290 ml  Output    975 ml  Net   -685 ml   Filed Weights    06/17/15 1538 06/18/15 0700 06/19/15 0529  Weight: 72.984 kg (160 lb 14.4 oz) 70.8 kg (156 lb 1.4 oz) 69.9 kg (154 lb 1.6 oz)    Exam  General: Well developed, well nourished, NAD  HEENT: NCAT, Sclera icterus, mucous membranes moist.   Cardiovascular: S1 S2 auscultated, no murmurs, RRR  Respiratory: Clear to auscultation bilaterally  Abdomen: Soft, nontender, +distended, + bowel sounds  Extremities: warm dry without cyanosis clubbing, edema  Neuro: AAOx3, nonfocal  Skin: Without rashes exudates or nodules, jaundice  Psych: Appropriate mood and affect   Data Reviewed: I have personally reviewed following labs and imaging studies  CBC:  Recent Labs Lab 06/17/15 1540 06/18/15 0644 06/19/15 0600 06/20/15 0514  WBC 17.6* 15.8* 16.0* 19.5*  NEUTROABS  --  13.5*  --   --   HGB 12.1* 12.0* 11.8* 11.9*  HCT 34.5* 34.3* 33.8* 34.8*  MCV 101.5* 101.8* 101.5* 104.2*  PLT 277 274 268 257   Basic Metabolic Panel:  Recent Labs Lab 06/17/15 1540 06/18/15 0644 06/19/15 0600 06/20/15 0514  NA 123* 124* 123* 125*  K 3.3* 2.9* 3.3* 3.6  CL 88* 88* 89* 94*  CO2 23 23 22  20*  GLUCOSE 104* 106* 115* 103*  BUN 22* 23* 25* 24*  CREATININE 1.43* 1.58* 1.29* 1.18  CALCIUM 8.2* 8.0* 8.1* 8.2*   GFR: Estimated Creatinine Clearance: 77.3 mL/min (by C-G formula based on Cr of 1.18). Liver Function Tests:  Recent Labs Lab 06/17/15 1540 06/17/15 2126 06/18/15 0644 06/19/15 0600 06/20/15 0514  AST 222*  --  201* 171* 148*  ALT 58  --  49 43 44  ALKPHOS 237*  --  219* 204* 200*  BILITOT 23.7* 21.9* 21.6* 22.2* 21.5*  PROT 6.8  --  6.5 6.1* 6.1*  ALBUMIN 2.0*  --  1.8* 1.7* 1.8*    Recent Labs Lab 06/17/15 1540  LIPASE 69*    Recent Labs Lab 06/17/15 2126  AMMONIA 55*   Coagulation Profile:  Recent Labs Lab 06/17/15 2126 06/18/15 0644 06/19/15 0600  INR 1.73* 1.86* 1.61*   Cardiac Enzymes: No results for input(s): CKTOTAL, CKMB, CKMBINDEX, TROPONINI in  the last 168 hours. BNP (last 3 results) No results for input(s): PROBNP in the last 8760 hours. HbA1C: No results for input(s): HGBA1C in the last 72 hours. CBG:  Recent Labs Lab 06/19/15 1834  GLUCAP 135*   Lipid Profile: No results for input(s): CHOL, HDL, LDLCALC, TRIG, CHOLHDL, LDLDIRECT in the last 72 hours. Thyroid Function Tests: No results for input(s): TSH, T4TOTAL, FREET4, T3FREE, THYROIDAB in the last 72 hours. Anemia Panel:  Recent Labs  06/18/15 0010  VITAMINB12 1147*  FOLATE 4.6*  FERRITIN 137  TIBC 176*  IRON 31*  RETICCTPCT 1.5   Urine analysis:    Component Value Date/Time   COLORURINE AMBER* 06/17/2015 1545   APPEARANCEUR CLOUDY* 06/17/2015 1545   LABSPEC 1.018 06/17/2015 1545   PHURINE 5.5 06/17/2015 1545   GLUCOSEU NEGATIVE 06/17/2015 1545   HGBUR NEGATIVE 06/17/2015 1545   BILIRUBINUR LARGE* 06/17/2015 1545   KETONESUR 15* 06/17/2015 1545   PROTEINUR NEGATIVE 06/17/2015 1545   NITRITE POSITIVE* 06/17/2015 1545   LEUKOCYTESUR SMALL* 06/17/2015 1545  Sepsis Labs: @LABRCNTIP (procalcitonin:4,lacticidven:4)  ) Recent Results (from the past 240 hour(s))  Culture, Urine     Status: None   Collection Time: 06/18/15  6:10 AM  Result Value Ref Range Status   Specimen Description URINE, RANDOM  Final   Special Requests NONE  Final   Culture NO GROWTH  Final   Report Status 06/19/2015 FINAL  Final  Gram stain     Status: None   Collection Time: 06/18/15  9:33 AM  Result Value Ref Range Status   Specimen Description FLUID PERITONEAL  Final   Special Requests NONE  Final   Gram Stain   Final    FEW WBC PRESENT, PREDOMINANTLY MONONUCLEAR NO ORGANISMS SEEN    Report Status 06/18/2015 FINAL  Final  Culture, body fluid-bottle     Status: None (Preliminary result)   Collection Time: 06/18/15  9:33 AM  Result Value Ref Range Status   Specimen Description FLUID PERITONEAL  Final   Special Requests NONE  Final   Culture NO GROWTH 2 DAYS  Final    Report Status PENDING  Incomplete      Radiology Studies: No results found.   Scheduled Meds: . bacitracin   Topical BID  . cefTRIAXone (ROCEPHIN)  IV  2 g Intravenous Q24H  . docusate sodium  100 mg Oral BID  . folic acid  1 mg Oral Daily  . hydrocortisone cream   Topical BID  . LORazepam  0-4 mg Oral Q12H  . multivitamin with minerals  1 tablet Oral Daily  . pantoprazole  40 mg Oral Daily  . polyethylene glycol  17 g Oral Daily  . [START ON 06/21/2015] prednisoLONE  40 mg Oral Daily  . sodium chloride flush  3 mL Intravenous Q12H  . thiamine  100 mg Oral Daily   Continuous Infusions:    LOS: 3 days   Time Spent in minutes   30 minutes  Michael [name] D.O. on 06/20/2015 at 1:02 PM  Between 7am to 7pm - Pager - [phone]  After 7pm go to [email or web address] - password [email or web address]  And look for the night coverage person covering for me after hours  [facility] Office  [phone]

## 2015-06-21 LAB — COMPREHENSIVE METABOLIC PANEL
ALBUMIN: 1.8 g/dL — AB (ref 3.5–5.0)
ALK PHOS: 214 U/L — AB (ref 38–126)
ALT: 47 U/L (ref 17–63)
ANION GAP: 10 (ref 5–15)
AST: 153 U/L — AB (ref 15–41)
BILIRUBIN TOTAL: 21 mg/dL — AB (ref 0.3–1.2)
BUN: 26 mg/dL — AB (ref 6–20)
CALCIUM: 8.4 mg/dL — AB (ref 8.9–10.3)
CO2: 22 mmol/L (ref 22–32)
CREATININE: 1.12 mg/dL (ref 0.61–1.24)
Chloride: 94 mmol/L — ABNORMAL LOW (ref 101–111)
GFR calc Af Amer: >60 mL/min (ref >60)
GFR calc non Af Amer: >60 mL/min (ref >60)
GLUCOSE: 110 mg/dL — AB (ref 65–99)
Potassium: 3.6 mmol/L (ref 3.5–5.1)
Sodium: 126 mmol/L — ABNORMAL LOW (ref 135–145)
TOTAL PROTEIN: 6.1 g/dL — AB (ref 6.5–8.1)

## 2015-06-21 LAB — CBC
HCT: 34.6 % — ABNORMAL LOW (ref 39.0–52.0)
Hemoglobin: 12.1 g/dL — ABNORMAL LOW (ref 13.0–17.0)
MCH: 35.7 pg — ABNORMAL HIGH (ref 26.0–34.0)
MCHC: 35 g/dL (ref 30.0–36.0)
MCV: 102.1 fL — ABNORMAL HIGH (ref 78.0–100.0)
Platelets: 284 10*3/uL (ref 150–400)
RBC: 3.39 MIL/uL — ABNORMAL LOW (ref 4.22–5.81)
RDW: 14.1 % (ref 11.5–15.5)
WBC: 18.4 10*3/uL — AB (ref 4.0–10.5)

## 2015-06-21 LAB — PROTIME-INR
INR: 1.59 — ABNORMAL HIGH (ref 0.00–1.49)
Prothrombin Time: 18.9 seconds — ABNORMAL HIGH (ref 11.6–15.2)

## 2015-06-21 MED ORDER — HYDROCORTISONE 2.5 % RE CREA
TOPICAL_CREAM | Freq: Four times a day (QID) | RECTAL | Status: DC
  Administered 2015-06-22: 11:00:00 via RECTAL
  Filled 2015-06-21: qty 28.35

## 2015-06-21 MED ORDER — FUROSEMIDE 40 MG PO TABS
40.0000 mg | ORAL_TABLET | Freq: Every day | ORAL | Status: DC
  Administered 2015-06-21 – 2015-06-23 (×3): 40 mg via ORAL
  Filled 2015-06-21 (×3): qty 1

## 2015-06-21 MED ORDER — SPIRONOLACTONE 25 MG PO TABS
50.0000 mg | ORAL_TABLET | Freq: Two times a day (BID) | ORAL | Status: DC
  Administered 2015-06-21 – 2015-06-23 (×5): 50 mg via ORAL
  Filled 2015-06-21 (×4): qty 2

## 2015-06-21 MED ORDER — BISACODYL 10 MG RE SUPP
10.0000 mg | Freq: Once | RECTAL | Status: AC
  Administered 2015-06-21: 10 mg via RECTAL
  Filled 2015-06-21: qty 1

## 2015-06-21 MED ORDER — HYDROCORTISONE ACETATE 25 MG RE SUPP
25.0000 mg | Freq: Two times a day (BID) | RECTAL | Status: DC
  Administered 2015-06-21: 25 mg via RECTAL
  Filled 2015-06-21 (×3): qty 1

## 2015-06-21 NOTE — Progress Notes (Addendum)
PROGRESS NOTE    Michael [name]  ZOX:[id number] DOB: October 23, 1970 DOA: 06/17/2015 PCP: No PCP Per Patient   Brief Narrative:  Michael [name] is a 45 y.o. male with medical history significant of alcoholism presents to the ER because of increasing jaundice and abdominal distention. Patient states over the last 3 weeks patient was noticing increasing abdominal distention and lower extremity edema and family noticed patient having jaundice. Imaging showed liver cirrhosis.  Underwent paracentesis. GI and gen surg consulted.   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 Patient would not be eligible for liver transplant until he is 6 month sober (without alcohol) -MELD score 28, Maddrey discriminant function 65 -Hepatitis panel: Hep A total + (likely vaccination vs exposure), Hep B/C nonreactive -Nutrition consulted to discuss low sodium diet and other modifications -Kidney injury improving, diuretics- lasix and spironolactone started today -GI now recommends continuing prednisolone 40 mg daily for 4 weeks, then tapering off over a two-week period. Trental discontinued.  Coagulopathy -Secondary to the above, INR 1.59 -Vitamin K has been ordered upon admission -Continue to monitor INR  Macrocytic anemia -Anemia panel: Iron 31, ferritin 137, TIBC 176 -Vitamin D 01/08/1948 -Hemoglobin currently 12.1, continue to monitor CBC  Acute kidney injury -Creatinine 2016 0.99, rose to 1.58, currently 1.12 -Holding IV fluids due to volume overload -Continue to monitor CMP  Hyponatremia -Likely secondary to volume overload -Urine osmolality 392, urine sodium less than 10 -Sodium 126 -Continue to monitor CMP  Alcohol and tobacco  abuse -Discussed cessation -Continue CIWA protocol -Discussed support groups and AA, declined social work consult  Gallbladder sludge/thickening -Noted on [place] -General surgery consulted - could order HIDA to assess for cholecystitis but tracer may not be taken up  Hypokalemia -Resolved, continue to monitor CMP  Leukocytosis -Likely secondary to steroids -No source of infections, although patient is on ceftriaxone empirically -Continue to monitor CBC  DVT Prophylaxis  SCDs  Code Status: Full  Family Communication: Mother at bedside  Disposition Plan: Admitted. Continue to monitor  Consultants Gastroenterology  General surgery   Procedures  Abdominal US Paracentesis  Antibiotics   Anti-infectives    Start     Dose/Rate Route Frequency Ordered Stop   06/18/15 0000  cefTRIAXone (ROCEPHIN) 2 g in dextrose 5 % 50 mL IVPB     2 g 100 mL/hr over 30 Minutes Intravenous Every 24 hours 06/17/15 2341        Subjective:   Michael [name] seen and examined today.   Patient is feeling mildly better.  Continue to have abdominal bloating and distention.  Denies nausea, vomiting, chest pain or shortness of breath.  Objective:   Filed Vitals:   06/20/15 1226 06/20/15 1800 06/20/15 2355 06/21/15 0608  BP: 120/79 120/79 120/74 116/66  Pulse: 98 98 88 98  Temp: 99 F (37.2 C) 99 F (37.2 C) 98.3 F (36.8 C) 98.3 F (36.8 C)  TempSrc: Oral Oral  Oral  Resp: Height:      Weight:    68.2 kg (150 lb 5.7 oz)  SpO2: 99% 99% 97% 97%    Intake/Output Summary (Last 24 hours) at 06/21/15 1348 Last data filed at 06/21/15 8119  Gross per 24 hour  Intake    272 ml  Output    580 ml  Net   -308 ml   Filed Weights   06/18/15 0700 06/19/15 0529 06/21/15 0608  Weight: 70.8 kg (156 lb 1.4 oz) 69.9 kg (154 lb 1.6 oz) 68.2 kg (150 lb 5.7 oz)    Exam  General: Well developed, well nourished, NAD  HEENT: NCAT, Sclera icterus, mucous membranes moist.   Cardiovascular: S1  S2 auscultated, no murmurs, RRR  Respiratory: Clear to auscultation bilaterally  Abdomen: Soft, nontender, +distended, + bowel sounds  Extremities: warm dry without cyanosis clubbing, edema  Neuro: AAOx3, nonfocal  Skin: Without rashes exudates or nodules, jaundice  Psych: Appropriate mood and affect, pleasant  Data Reviewed: I have personally reviewed following labs and imaging studies  CBC:  Recent Labs Lab 06/17/15 1540 06/18/15 0644 06/19/15 0600 06/20/15 0514 06/21/15 0429  WBC 17.6* 15.8* 16.0* 19.5* 18.4*  NEUTROABS  --  13.5*  --   --   --   HGB 12.1* 12.0* 11.8* 11.9* 12.1*  HCT 34.5* 34.3* 33.8* 34.8* 34.6*  MCV 101.5* 101.8* 101.5* 104.2* 102.1*  PLT 277 274 268 257 284   Basic Metabolic Panel:  Recent Labs Lab 06/17/15 1540 06/18/15 0644 06/19/15 0600 06/20/15 0514 06/21/15 0429  NA 123* 124* 123* 125* 126*  K 3.3* 2.9* 3.3* 3.6 3.6  CL 88* 88* 89* 94* 94*  CO2 23 23 22  20* 22  GLUCOSE 104* 106* 115* 103* 110*  BUN 22* 23* 25* 24* 26*  CREATININE 1.43* 1.58* 1.29* 1.18 1.12  CALCIUM 8.2* 8.0* 8.1* 8.2* 8.4*   GFR: Estimated Creatinine Clearance: 79.5 mL/min (by C-G formula based on Cr of 1.12). Liver Function Tests:  Recent Labs Lab 06/17/15 1540 06/17/15 2126 06/18/15 0644 06/19/15 0600 06/20/15 0514 06/21/15 0429  AST 222*  --  201* 171* 148* 153*  ALT 58  --  49 43 44 47  ALKPHOS 237*  --  219* 204* 200* 214*  BILITOT 23.7* 21.9* 21.6* 22.2* 21.5* 21.0*  PROT 6.8  --  6.5 6.1* 6.1* 6.1*  ALBUMIN 2.0*  --  1.8* 1.7* 1.8* 1.8*    Recent Labs Lab 06/17/15 1540  LIPASE 69*    Recent Labs Lab 06/17/15 2126  AMMONIA 55*   Coagulation Profile:  Recent Labs Lab 06/17/15 2126 06/18/15 0644 06/19/15 0600 06/21/15 0429  INR 1.73* 1.86* 1.61* 1.59*   Cardiac Enzymes: No results for input(s): CKTOTAL, CKMB, CKMBINDEX, TROPONINI in the last 168 hours. BNP (last 3 results) No results for input(s): PROBNP in the last 8760  hours. HbA1C: No results for input(s): HGBA1C in the last 72 hours. CBG:  Recent Labs Lab 06/19/15 1834  GLUCAP 135*   Lipid Profile: No results for input(s): CHOL, HDL, LDLCALC, TRIG, CHOLHDL, LDLDIRECT in the last 72 hours. Thyroid Function Tests: No results for input(s): TSH, T4TOTAL, FREET4, T3FREE, THYROIDAB in the last 72 hours. Anemia Panel: No results for input(s): VITAMINB12, FOLATE, FERRITIN, TIBC, IRON, RETICCTPCT in the last 72 hours. Urine analysis:    Component Value Date/Time   COLORURINE AMBER* 06/17/2015 1545   APPEARANCEUR CLOUDY* 06/17/2015 1545   LABSPEC 1.018 06/17/2015 1545   PHURINE 5.5 06/17/2015 1545   GLUCOSEU NEGATIVE 06/17/2015 1545   HGBUR NEGATIVE 06/17/2015 1545   BILIRUBINUR LARGE* 06/17/2015 1545   KETONESUR 15* 06/17/2015 1545   PROTEINUR NEGATIVE 06/17/2015 1545   NITRITE POSITIVE* 06/17/2015 1545   LEUKOCYTESUR SMALL* 06/17/2015 1545   Sepsis Labs: @LABRCNTIP (procalcitonin:4,lacticidven:4)  ) Recent Results (from the past 240 hour(s))  Culture, Urine     Status: None  Collection Time: 06/18/15  6:10 AM  Result Value Ref Range Status   Specimen Description URINE, RANDOM  Final   Special Requests NONE  Final   Culture NO GROWTH  Final   Report Status 06/19/2015 FINAL  Final  Gram stain     Status: None   Collection Time: 06/18/15  9:33 AM  Result Value Ref Range Status   Specimen Description FLUID PERITONEAL  Final   Special Requests NONE  Final   Gram Stain   Final    FEW WBC PRESENT, PREDOMINANTLY MONONUCLEAR NO ORGANISMS SEEN    Report Status 06/18/2015 FINAL  Final  Culture, body fluid-bottle     Status: None (Preliminary result)   Collection Time: 06/18/15  9:33 AM  Result Value Ref Range Status   Specimen Description FLUID PERITONEAL  Final   Special Requests NONE  Final   Culture NO GROWTH 3 DAYS  Final   Report Status PENDING  Incomplete      Radiology Studies: No results found.   Scheduled Meds: .  bacitracin   Topical BID  . cefTRIAXone (ROCEPHIN)  IV  2 g Intravenous Q24H  . docusate sodium  100 mg Oral BID  . folic acid  1 mg Oral Daily  . furosemide  40 mg Oral Daily  . hydrocortisone  25 mg Rectal BID  . hydrocortisone cream   Topical BID  . LORazepam  0-4 mg Oral Q12H  . multivitamin with minerals  1 tablet Oral Daily  . pantoprazole  40 mg Oral Daily  . polyethylene glycol  17 g Oral Daily  . prednisoLONE  40 mg Oral Daily  . sodium chloride flush  3 mL Intravenous Q12H  . spironolactone  50 mg Oral BID  . thiamine  100 mg Oral Daily   Continuous Infusions:    LOS: 4 days   Time Spent in minutes   30 minutes  Amahd Morino D.O. on 06/21/2015 at 1:48 PM  Between 7am to 7pm - Pager - [phone]  After 7pm go to [email or web address] - password [email or web address]  And look for the night coverage person covering for me after hours  [facility] Office  [phone]

## 2015-06-21 NOTE — Progress Notes (Signed)
Feeling somewhat better. Abdomen still very tense with ascites. Alert, coherent, no overt encephalopathy. Labs essentially stable, minimally improved.  Plan: In view of progressive improvement and normalization of renal function, we will start low-dose diuretic therapy and observe renal function.  Discussed case w/ Dr. Catha GosselinMikhail.  Michael [name], M.D. Pager [phone] If no answer or after 5 PM call [phone]

## 2015-06-21 NOTE — Plan of Care (Signed)
Problem: Food- and Nutrition-Related Knowledge Deficit (NB-1.1) Goal: Nutrition education Formal process to instruct or train a patient/client in a skill or to impart knowledge to help patients/clients voluntarily manage or modify food choices and eating behavior to maintain or improve health. Outcome: Completed/Met Date Met:  06/21/15 Nutrition Education Note  RD consulted for nutrition education regarding 2g Sodium Diet  RD provided "Low Sodium Nutrition Therapy" handout from the Academy of [employer]etics. Reviewed patient's dietary recall. Provided examples on ways to decrease sodium intake in diet. Discouraged intake of processed foods and use of salt shaker. Encouraged fresh fruits and vegetables as well as whole grain sources of carbohydrates to maximize fiber intake.   RD discussed why it is important for patient to adhere to diet recommendations, and emphasized the role of fluids, foods to avoid, and importance of weighing self daily. Teach back method used.  Expect fair compliance.  Body mass index is 22.19 kg/(m^2). Pt meets criteria for normal based on current BMI.  Current diet order is 2g NA, patient is consuming approximately 100% of meals at this time. Labs and medications reviewed. No further nutrition interventions warranted at this time. RD contact information provided. If additional nutrition issues arise, please re-consult RD.   Satira Anis. Tatiyanna Lashley, MS, RD LDN Inpatient Clinical Dietitian Pager [phone]

## 2015-06-22 ENCOUNTER — Inpatient Hospital Stay (HOSPITAL_COMMUNITY): Payer: MEDICAID

## 2015-06-22 DIAGNOSIS — D689 Coagulation defect, unspecified: Secondary | ICD-10-CM

## 2015-06-22 DIAGNOSIS — R188 Other ascites: Secondary | ICD-10-CM

## 2015-06-22 DIAGNOSIS — K7010 Alcoholic hepatitis without ascites: Secondary | ICD-10-CM

## 2015-06-22 DIAGNOSIS — K7200 Acute and subacute hepatic failure without coma: Secondary | ICD-10-CM

## 2015-06-22 DIAGNOSIS — N179 Acute kidney failure, unspecified: Secondary | ICD-10-CM

## 2015-06-22 DIAGNOSIS — K7031 Alcoholic cirrhosis of liver with ascites: Principal | ICD-10-CM

## 2015-06-22 LAB — HEPATIC FUNCTION PANEL
ALBUMIN: 1.7 g/dL — AB (ref 3.5–5.0)
ALK PHOS: 224 U/L — AB (ref 38–126)
ALT: 46 U/L (ref 17–63)
AST: 151 U/L — AB (ref 15–41)
Bilirubin, Direct: 9.1 mg/dL — ABNORMAL HIGH (ref 0.1–0.5)
Indirect Bilirubin: 7.8 mg/dL — ABNORMAL HIGH (ref 0.3–0.9)
TOTAL PROTEIN: 5.7 g/dL — AB (ref 6.5–8.1)
Total Bilirubin: 16.9 mg/dL — ABNORMAL HIGH (ref 0.3–1.2)

## 2015-06-22 LAB — BASIC METABOLIC PANEL
ANION GAP: 9 (ref 5–15)
BUN: 27 mg/dL — ABNORMAL HIGH (ref 6–20)
CALCIUM: 8.2 mg/dL — AB (ref 8.9–10.3)
CO2: 22 mmol/L (ref 22–32)
Chloride: 95 mmol/L — ABNORMAL LOW (ref 101–111)
Creatinine, Ser: 1.02 mg/dL (ref 0.61–1.24)
GLUCOSE: 91 mg/dL (ref 65–99)
POTASSIUM: 3.4 mmol/L — AB (ref 3.5–5.1)
SODIUM: 126 mmol/L — AB (ref 135–145)

## 2015-06-22 LAB — CBC
HCT: 33.7 % — ABNORMAL LOW (ref 39.0–52.0)
HEMOGLOBIN: 11.5 g/dL — AB (ref 13.0–17.0)
MCH: 35 pg — ABNORMAL HIGH (ref 26.0–34.0)
MCHC: 34.1 g/dL (ref 30.0–36.0)
MCV: 102.4 fL — ABNORMAL HIGH (ref 78.0–100.0)
Platelets: 276 10*3/uL (ref 150–400)
RBC: 3.29 MIL/uL — AB (ref 4.22–5.81)
RDW: 14.1 % (ref 11.5–15.5)
WBC: 18.9 10*3/uL — AB (ref 4.0–10.5)

## 2015-06-22 MED ORDER — LORAZEPAM 0.5 MG PO TABS
0.5000 mg | ORAL_TABLET | Freq: Three times a day (TID) | ORAL | Status: DC | PRN
  Administered 2015-06-22 – 2015-06-23 (×3): 0.5 mg via ORAL
  Filled 2015-06-22 (×3): qty 1

## 2015-06-22 MED ORDER — LIDOCAINE HCL (PF) 1 % IJ SOLN
INTRAMUSCULAR | Status: AC
  Filled 2015-06-22: qty 10

## 2015-06-22 MED ORDER — POTASSIUM CHLORIDE CRYS ER 20 MEQ PO TBCR
40.0000 meq | EXTENDED_RELEASE_TABLET | Freq: Once | ORAL | Status: AC
  Administered 2015-06-22: 40 meq via ORAL
  Filled 2015-06-22: qty 2

## 2015-06-22 NOTE — Progress Notes (Signed)
Patient ID: Michael [name], male   DOB: [birth year], 45 y.o.   MRN: [id number] [facility]  Michael [name] 45 y.o. [birth year]   Subjective: Feels distended with abdominal pain. Having rectal pain. Having loose stools. Significant other in room.  Objective: Vital signs in last 24 hours: Filed Vitals:   06/22/15 0635 06/22/15 0636  BP: 98/73 106/64  Pulse: 91 88  Temp: 98.2 F (36.8 C)   Resp: 17     Physical Exam: Gen: lethargic, no acute distress HEENT: +scleral icterus CV: RRR Chest: CTA B Abd: distended, nontender, +BS Ext: no edema  Lab Results:  Recent Labs  06/21/15 0429 06/22/15 0537  NA 126* 126*  K 3.6 3.4*  CL 94* 95*  CO2 22 22  GLUCOSE 110* 91  BUN 26* 27*  CREATININE 1.12 1.02  CALCIUM 8.4* 8.2*    Recent Labs  06/21/15 0429 06/22/15 0537  AST 153* 151*  ALT 47 46  ALKPHOS 214* 224*  BILITOT 21.0* 16.9*  PROT 6.1* 5.7*  ALBUMIN 1.8* 1.7*    Recent Labs  06/21/15 0429 06/22/15 0537  WBC 18.4* 18.9*  HGB 12.1* 11.5*  HCT 34.6* 33.7*  MCV 102.1* 102.4*  PLT 284 276    Recent Labs  06/21/15 0429  LABPROT 18.9*  INR 1.59*      Assessment/Plan: Severe alcoholic hepatitis with ascites - repeat paracentesis today. Continue Prednisone for alcoholic hepatitis due to high discriminant function. Continue supportive care. Continue Hydrocortisone cream for anal area.   Leonte Horrigan C. 06/22/2015, 2:22 PM  Pager [phone]  If no answer or after 5 PM call [phone]

## 2015-06-22 NOTE — Procedures (Signed)
Ultrasound-guided therapeutic paracentesis performed yielding 3.5 liters of bilious yellow colored fluid. No immediate complications.  Purl Claytor E 3:15 PM 06/22/2015

## 2015-06-22 NOTE — Progress Notes (Signed)
[facility]                                                                              Patient Demographics  Michael [name], is a 45 y.o. male, DOB - 08/16/1970, ZOX:[id number]  Admit date - 06/17/2015   Admitting Physician Eduard Clos, MD  Outpatient Primary MD for the patient is No PCP Per Patient  Outpatient specialists:   LOS - 5  days    Chief Complaint  Patient presents with  . Joint Swelling    ankles bilaterally       Brief summary   Antolin Belsito is a 45 y.o. male with medical history significant of alcoholism presents to the ER because of increasing jaundice and abdominal distention. Patient states over the last 3 weeks patient was noticing increasing abdominal distention and lower extremity edema and family noticed patient having jaundice. Imaging showed liver cirrhosis. Underwent paracentesis. GI and gen surg consulted.     Assessment & Plan   Liver cirrhosis likely secondary to alcoholism with ascites/alcoholic hepatitis-New diagnosis -s/p paracentesis, 3L fluid removed, no growth to date -Continue empiric ceftriaxone for SBP coverage -Gastroenterology consulted, started patient on Trental and prednisolone. Patient would not be eligible for liver transplant until he is 6 month sober (without alcohol) -MELD score 28, DIF 65 -Hepatitis panel: Hep A total + (likely vaccination vs prior exposure), hep A IgM negative,  Hep B/C nonreactive - Continue Lasix, Aldactone -GI now recommends continuing prednisolone 40 mg daily for 4 weeks, then tapering off over a two-week period. Trental discontinued. - Patient complaining of "tight abdomen",  order ultrasound-guided paracentesis - Total bilirubin improving, 16.9 today from 21 5/23  Coagulopathy -Secondary to the above, INR 1.59 -Vitamin K has been ordered upon admission -Continue to monitor INR  Macrocytic anemia, folate deficiency -Anemia panel: Iron 31, ferritin 137, TIBC 176 - Folate  down to 4.6, B12 within normal limits, Continue folic acid replacement  Acute kidney injury -Creatinine 2016 0.99, rose to 1.58, improved to normal  Hyponatremia due to liver cirrhosis and volume overload -Urine osmolality 392, urine sodium less than 10 -Sodium 126 -Continue to monitor CMP  Alcohol and tobacco abuse -Continue CIWA pro with Ativan tocol -Discussed support groups and AA, declined social work consult  Gallbladder sludge/thickening -Noted on [place] -General surgery consulted - could order HIDA to assess for cholecystitis but tracer may not be taken up  Hypokalemia -Resolved, continue to monitor CMP  Leukocytosis -Likely secondary to steroids -No source of infections, although patient is on ceftriaxone empirically -Continue to monitor CBC   Code Status: Full CODE STATUS  DVT Prophylaxis:  SCDs   Family Communication: Discussed in detail with the patient, all imaging results, lab results explained to the patient and mother   Disposition Plan: Pending paracentesis, hopefully DC in a.m.  Time Spent in minutes  25 minutes  Procedures:  Paracentesis  Consultants:   GI  Antimicrobials:   Rocephin Medications  Scheduled Meds: . bacitracin   Topical BID  . cefTRIAXone (ROCEPHIN)  IV  2 g Intravenous Q24H  . docusate sodium  100 mg Oral BID  . folic acid  1 mg Oral Daily  . furosemide  40 mg Oral Daily  . hydrocortisone   Rectal QID  . hydrocortisone  25 mg Rectal BID  . hydrocortisone cream   Topical BID  . multivitamin with minerals  1 tablet Oral Daily  . pantoprazole  40 mg Oral Daily  . polyethylene glycol  17 g Oral Daily  . prednisoLONE  40 mg Oral Daily  . sodium chloride flush  3 mL Intravenous Q12H  . spironolactone  50 mg Oral BID  . thiamine  100 mg Oral Daily   Continuous Infusions:  PRN Meds:.LORazepam, ondansetron **OR** ondansetron (ZOFRAN) IV   Antibiotics   Anti-infectives    Start     Dose/Rate Route Frequency Ordered Stop    06/18/15 0000  cefTRIAXone (ROCEPHIN) 2 g in dextrose 5 % 50 mL IVPB     2 g 100 mL/hr over 30 Minutes Intravenous Every 24 hours 06/17/15 2341          Subjective:   Vincente Asbridge was seen and examined today.Complaining of abdominal distention, discomfort. Patient denies dizziness, chest pain, shortness of breath,  new weakness, numbess, tingling. No acute events overnight.    Objective:   Filed Vitals:   06/21/15 1437 06/21/15 2107 06/22/15 0635 06/22/15 0636  BP: 129/79 143/96 98/73 106/64  Pulse: 102 99 91 88  Temp: 98.9 F (37.2 C) 98.4 F (36.9 C) 98.2 F (36.8 C)   TempSrc: Oral Oral Oral   Resp: 20 17 17    Height:      Weight:    70.2 kg (154 lb 12.2 oz)  SpO2: 100% 98% 96%     Intake/Output Summary (Last 24 hours) at 06/22/15 1222 Last data filed at 06/22/15 1041  Gross per 24 hour  Intake    710 ml  Output   1000 ml  Net   -290 ml     Wt Readings from Last 3 Encounters:  06/22/15 70.2 kg (154 lb 12.2 oz)  08/06/14 65.772 kg (145 lb)  07/20/14 65.772 kg (145 lb)     Exam  General: Alert and oriented x 3, NAD  HEENT:  PERRLA, EOMI, Anicteric Sclera, mucous membranes moist.   Neck: Supple, no JVD, no masses  Cardiovascular: S1 S2 auscultated, no rubs, murmurs or gallops. Regular rate and rhythm.  Respiratory: Clear to auscultation bilaterally, no wheezing, rales or rhonchi  Gastrointestinal: Soft, Mild diffuse tenderness, ascites/distended, + bowel sounds  Ext: no cyanosis clubbing or edema  Neuro: AAOx3, Cr N's II- XII. Strength 5/5 upper and lower extremities bilaterally  Skin: No rashes  Psych: Normal affect and demeanor, alert and oriented x3    Data Reviewed:  I have personally reviewed following labs and imaging studies  Micro Results Recent Results (from the past 240 hour(s))  Culture, Urine     Status: None   Collection Time: 06/18/15  6:10 AM  Result Value Ref Range Status   Specimen Description URINE, RANDOM  Final   Special  Requests NONE  Final   Culture NO GROWTH  Final   Report Status 06/19/2015 FINAL  Final  Gram stain     Status: None   Collection Time: 06/18/15  9:33 AM  Result Value Ref Range Status   Specimen Description FLUID PERITONEAL  Final   Special Requests NONE  Final   Gram Stain   Final    FEW WBC PRESENT, PREDOMINANTLY MONONUCLEAR NO ORGANISMS SEEN    Report Status 06/18/2015 FINAL  Final  Culture, body  fluid-bottle     Status: None (Preliminary result)   Collection Time: 06/18/15  9:33 AM  Result Value Ref Range Status   Specimen Description FLUID PERITONEAL  Final   Special Requests NONE  Final   Culture NO GROWTH 3 DAYS  Final   Report Status PENDING  Incomplete    Radiology Reports Dg Chest 2 View  06/17/2015  CLINICAL DATA:  Shortness of breath EXAM: CHEST  2 VIEW COMPARISON:  07/13/2014 chest radiograph. FINDINGS: Stable cardiomediastinal silhouette with normal heart size. No pneumothorax. No pleural effusion. Minimal scarring versus atelectasis at the anterior left lung base. No pulmonary edema. No acute consolidative airspace disease. IMPRESSION: Minimal scarring versus atelectasis at the anterior left lung base. Otherwise no active disease in the chest. Electronically Signed   By: Delbert Phenix M.D.   On: 06/17/2015 19:29   US Abdomen Complete  06/17/2015  CLINICAL DATA:  Abdominal pain and distention.  Alcohol abuse. EXAM: ABDOMEN ULTRASOUND COMPLETE COMPARISON:  None. FINDINGS: Gallbladder: Nondistended gallbladder contains several tiny echogenic non shadowing foci in the dependent gallbladder measuring up to 5 mm in size, probably sludge balls or tiny non shadowing gallstones. Mild diffuse gallbladder wall thickening. No pericholecystic fluid or sonographic Murphy sign. Common bile duct: Diameter: 4 mm Liver: Liver parenchyma is diffusely coarsened and echogenic, there is posterior acoustic attenuation in the liver and the liver surface appears slightly irregular, suggesting  cirrhosis in superimposed steatosis. No liver mass detected, noting decreased sensitivity in the setting of an echogenic liver. IVC: No abnormality visualized. Pancreas: Poorly visualized due to overlying bowel gas. Spleen: Size and appearance within normal limits more (craniocaudal splenic length 12.1 cm). Right Kidney: Length: 11.4 cm. Echogenicity within normal limits. No mass or hydronephrosis visualized. Left Kidney: Length: 10.3 cm. Echogenicity within normal limits. No mass or hydronephrosis visualized. Abdominal aorta: No aneurysm visualized. Other findings: Moderate volume ascites. IMPRESSION: 1. Sonographic findings are suggestive of cirrhosis and superimposed hepatic steatosis. No liver mass detected. 2. Moderate volume ascites.  Top-normal size spleen. 3. Tiny sludge balls versus non shadowing gallstones in the nondistended gallbladder. Mild diffuse gallbladder wall thickening without sonographic Murphy's sign, probably reactive or noninflammatory gallbladder wall edema. Electronically Signed   By: Delbert Phenix M.D.   On: 06/17/2015 20:28   US Paracentesis  06/18/2015  INDICATION: Jaundice; ascites EXAM: ULTRASOUND-GUIDED PARACENTESIS COMPARISON:  None. MEDICATIONS: 10 cc 1% lidocaine COMPLICATIONS: None immediate. TECHNIQUE: Informed written consent was obtained from the patient after a discussion of the risks, benefits and alternatives to treatment. A timeout was performed prior to the initiation of the procedure. Initial ultrasound scanning demonstrates a large amount of ascites within the right lower abdominal quadrant. The right lower abdomen was prepped and draped in the usual sterile fashion. 1% lidocaine with epinephrine was used for local anesthesia. Under direct ultrasound guidance, a 19 gauge, 7-cm, Yueh catheter was introduced. An ultrasound image was saved for documentation purposed. The paracentesis was performed. The catheter was removed and a dressing was applied. The patient tolerated  the procedure well without immediate post procedural complication. FINDINGS: A total of approximately 3 liters of dark yellow fluid was removed. Samples were sent to the laboratory as requested by the clinical team. IMPRESSION: Successful ultrasound-guided paracentesis yielding 3 liters of peritoneal fluid. Read by:  Robet Leu [facility] Electronically Signed   By: Simonne Come M.D.   On: 06/18/2015 09:50    Lab Data:  CBC:  Recent Labs Lab 06/18/15 0644 06/19/15 0600 06/20/15 9147  06/21/15 0429 06/22/15 0537  WBC 15.8* 16.0* 19.5* 18.4* 18.9*  NEUTROABS 13.5*  --   --   --   --   HGB 12.0* 11.8* 11.9* 12.1* 11.5*  HCT 34.3* 33.8* 34.8* 34.6* 33.7*  MCV 101.8* 101.5* 104.2* 102.1* 102.4*  PLT 274 268 257 284 276   Basic Metabolic Panel:  Recent Labs Lab 06/18/15 0644 06/19/15 0600 06/20/15 0514 06/21/15 0429 06/22/15 0537  NA 124* 123* 125* 126* 126*  K 2.9* 3.3* 3.6 3.6 3.4*  CL 88* 89* 94* 94* 95*  CO2 23 22 20* 22 22  GLUCOSE 106* 115* 103* 110* 91  BUN 23* 25* 24* 26* 27*  CREATININE 1.58* 1.29* 1.18 1.12 1.02  CALCIUM 8.0* 8.1* 8.2* 8.4* 8.2*   GFR: Estimated Creatinine Clearance: 89.9 mL/min (by C-G formula based on Cr of 1.02). Liver Function Tests:  Recent Labs Lab 06/18/15 0644 06/19/15 0600 06/20/15 0514 06/21/15 0429 06/22/15 0537  AST 201* 171* 148* 153* 151*  ALT 49 43 44 47 46  ALKPHOS 219* 204* 200* 214* 224*  BILITOT 21.6* 22.2* 21.5* 21.0* 16.9*  PROT 6.5 6.1* 6.1* 6.1* 5.7*  ALBUMIN 1.8* 1.7* 1.8* 1.8* 1.7*    Recent Labs Lab 06/17/15 1540  LIPASE 69*    Recent Labs Lab 06/17/15 2126  AMMONIA 55*   Coagulation Profile:  Recent Labs Lab 06/17/15 2126 06/18/15 0644 06/19/15 0600 06/21/15 0429  INR 1.73* 1.86* 1.61* 1.59*   Cardiac Enzymes: No results for input(s): CKTOTAL, CKMB, CKMBINDEX, TROPONINI in the last 168 hours. BNP (last 3 results) No results for input(s): PROBNP in the last 8760 hours. HbA1C: No results for  input(s): HGBA1C in the last 72 hours. CBG:  Recent Labs Lab 06/19/15 1834  GLUCAP 135*   Lipid Profile: No results for input(s): CHOL, HDL, LDLCALC, TRIG, CHOLHDL, LDLDIRECT in the last 72 hours. Thyroid Function Tests: No results for input(s): TSH, T4TOTAL, FREET4, T3FREE, THYROIDAB in the last 72 hours. Anemia Panel: No results for input(s): VITAMINB12, FOLATE, FERRITIN, TIBC, IRON, RETICCTPCT in the last 72 hours. Urine analysis:    Component Value Date/Time   COLORURINE AMBER* 06/17/2015 1545   APPEARANCEUR CLOUDY* 06/17/2015 1545   LABSPEC 1.018 06/17/2015 1545   PHURINE 5.5 06/17/2015 1545   GLUCOSEU NEGATIVE 06/17/2015 1545   HGBUR NEGATIVE 06/17/2015 1545   BILIRUBINUR LARGE* 06/17/2015 1545   KETONESUR 15* 06/17/2015 1545   PROTEINUR NEGATIVE 06/17/2015 1545   NITRITE POSITIVE* 06/17/2015 1545   LEUKOCYTESUR SMALL* 06/17/2015 1545     RAI,RIPUDEEP M.D. [facility] 06/22/2015, 12:22 PM  Pager: [phone] Between 7am to 7pm - call Pager - [phone]  After 7pm go to [email or web address] - password [email or web address]  Call night coverage person covering after 7pm

## 2015-06-23 DIAGNOSIS — D539 Nutritional anemia, unspecified: Secondary | ICD-10-CM

## 2015-06-23 DIAGNOSIS — K7290 Hepatic failure, unspecified without coma: Secondary | ICD-10-CM | POA: Insufficient documentation

## 2015-06-23 LAB — COMPREHENSIVE METABOLIC PANEL
ALBUMIN: 1.6 g/dL — AB (ref 3.5–5.0)
ALK PHOS: 222 U/L — AB (ref 38–126)
ALT: 51 U/L (ref 17–63)
ANION GAP: 9 (ref 5–15)
AST: 170 U/L — ABNORMAL HIGH (ref 15–41)
BUN: 24 mg/dL — ABNORMAL HIGH (ref 6–20)
CALCIUM: 8.2 mg/dL — AB (ref 8.9–10.3)
CHLORIDE: 97 mmol/L — AB (ref 101–111)
CO2: 22 mmol/L (ref 22–32)
Creatinine, Ser: 1.01 mg/dL (ref 0.61–1.24)
GFR calc non Af Amer: >60 mL/min (ref >60)
GLUCOSE: 122 mg/dL — AB (ref 65–99)
Potassium: 3.2 mmol/L — ABNORMAL LOW (ref 3.5–5.1)
SODIUM: 128 mmol/L — AB (ref 135–145)
Total Bilirubin: 13.9 mg/dL — ABNORMAL HIGH (ref 0.3–1.2)
Total Protein: 5.8 g/dL — ABNORMAL LOW (ref 6.5–8.1)

## 2015-06-23 LAB — CULTURE, BODY FLUID W GRAM STAIN -BOTTLE

## 2015-06-23 LAB — CULTURE, BODY FLUID-BOTTLE: CULTURE: NO GROWTH

## 2015-06-23 MED ORDER — PREDNISOLONE 5 MG PO TABS: 40.0000 mg | ORAL_TABLET | Freq: Every day | ORAL | Status: DC

## 2015-06-23 MED ORDER — FUROSEMIDE 40 MG PO TABS: 40.0000 mg | ORAL_TABLET | Freq: Every day | ORAL | Status: DC

## 2015-06-23 MED ORDER — BACITRACIN ZINC 500 UNIT/GM EX OINT: 1.0000 "application " | TOPICAL_OINTMENT | Freq: Two times a day (BID) | CUTANEOUS | Status: DC

## 2015-06-23 MED ORDER — THIAMINE HCL 100 MG PO TABS: 100.0000 mg | ORAL_TABLET | Freq: Every day | ORAL | Status: DC

## 2015-06-23 MED ORDER — SPIRONOLACTONE 50 MG PO TABS: 50.0000 mg | ORAL_TABLET | Freq: Two times a day (BID) | ORAL | Status: DC

## 2015-06-23 MED ORDER — LACTULOSE 10 GM/15ML PO SOLN: 10.0000 g | Freq: Two times a day (BID) | ORAL | Status: DC

## 2015-06-23 MED ORDER — POTASSIUM CHLORIDE CRYS ER 20 MEQ PO TBCR
40.0000 meq | EXTENDED_RELEASE_TABLET | Freq: Once | ORAL | Status: AC
  Administered 2015-06-23: 40 meq via ORAL
  Filled 2015-06-23: qty 2

## 2015-06-23 MED ORDER — HYDROCORTISONE 2.5 % RE CREA: TOPICAL_CREAM | Freq: Four times a day (QID) | RECTAL | Status: DC

## 2015-06-23 MED ORDER — PANTOPRAZOLE SODIUM 40 MG PO TBEC: 40.0000 mg | DELAYED_RELEASE_TABLET | Freq: Every day | ORAL | Status: DC

## 2015-06-23 MED ORDER — HYDROCORTISONE 1 % EX CREA: TOPICAL_CREAM | Freq: Two times a day (BID) | CUTANEOUS | Status: DC

## 2015-06-23 MED ORDER — FOLIC ACID 1 MG PO TABS: 1.0000 mg | ORAL_TABLET | Freq: Every day | ORAL | Status: DC

## 2015-06-23 MED ORDER — LACTULOSE 10 GM/15ML PO SOLN
10.0000 g | Freq: Two times a day (BID) | ORAL | Status: DC
  Filled 2015-06-23: qty 15

## 2015-06-23 MED ORDER — HYDROCORTISONE ACETATE 25 MG RE SUPP: 25.0000 mg | Freq: Two times a day (BID) | RECTAL | Status: DC

## 2015-06-23 MED ORDER — CLONAZEPAM 0.5 MG PO TABS: 0.5000 mg | ORAL_TABLET | Freq: Two times a day (BID) | ORAL | Status: DC | PRN

## 2015-06-23 MED FILL — FOLIC ACID 1 MG TABLET: 1 | 30 days supply | Qty: 30 | Fill #0

## 2015-06-23 MED FILL — ?PANTOPRAZOLE SOD DR 40MG: 40 MG | 30 days supply | Qty: 30 | Fill #0

## 2015-06-23 MED FILL — LACTULOSE 10 GM/15 ML SOLN: 10 | 8 days supply | Qty: 240 | Fill #0

## 2015-06-23 MED FILL — FUROSEMIDE 40 MG TABLET: 40 | 30 days supply | Qty: 30 | Fill #0

## 2015-06-23 MED FILL — ?SPIRONOLACTONE 50 MG TABLE: 50 | 30 days supply | Qty: 60 | Fill #0

## 2015-06-23 NOTE — Progress Notes (Addendum)
[facility]. Received notification from Lupita LeashDonna with [facility] at 3:00pm same day that they could not accept case after patient had been discharged. [facility] out to [facility], [facility] on a [facility] basis.  3:36 [facility]. Information provided to Tamala BariMary Manley RN

## 2015-06-23 NOTE — Progress Notes (Signed)
GASTROENTEROLOGY PROGRESS NOTE  Problem:   Alcoholic hepatitis, resolving.  Subjective: Complaint of painful hemorrhoids, otherwise, feels better since four- liter paracentesis yesterday.  Objective: Abdomen noticeably smaller since paracentesis. Liver now palpable, 4 fingerbreadths below the right costal margin, nontender. Spleen not palpable. Moderately severe prolapsed internal hemorrhoids present, digitally manipulated to reduce engorgement with blood.  Labs show rather significant drop in bilirubin level and slight further improvement in his already-normal renal function.  Assessment: Improving alcoholic hepatitis. Symptomatic hemorrhoids.  Plan: Continue current diuretic therapy and lab monitoring. Mild elevation of ammonia noted from about 6 days ago, but I do not feel this constitutes an indication for lactulose therapy, given his normal mental status. On the other hand, since he appears to need a laxative to maintain defecation, I have no objection to use of lactulose in this patient, in effect addressing two issues simultaneously.  Patient was educated in digital reduction of hemorrhoids. Topical therapy, which has been prescribed, might be slightly helpful adjunctively. Sitz baths as an outpatient may be more effective.   Florencia Reasonsobert V. Jocilyn Trego, M.D. 06/23/2015 1:39 PM  Pager [phone] If no answer or after 5 PM call [phone]

## 2015-06-23 NOTE — Care Management Note (Signed)
Case Management Note  Patient Details  Name: Michael [name] MRN: [id number] Date of Birth: 13-Apr-1970                 Subjective/Objective: Spoke with patient and his mom in the room. Patient lives at home alone, girlfriend lives two houses down the street. [facility] and [facility]. Adm for ascites/ cirrhosis. Michael repeated 5/24. Patient anxious about medication and disease management. [facility] with chronic disease state.     Action/Plan:  [facility] CM reviewed with patient on day of DC how to get meds, and where to go to F/U apts. HHRN to assist with management and labs.   Expected Discharge Date:  06/20/15               Expected Discharge Plan:  Home w Home Health Services  In-House Referral:     Discharge planning Services  CM Consult, [facility]  Post Acute Care Choice:  Home Health Choice offered to:  Patient  DME Arranged:    DME Agency:     HH Arranged:  RN HH Agency:  [facility]  Status of Service:  Completed, signed off  Medicare Important Message Given:    Date Medicare IM Given:    Medicare IM give by:    Date Additional Medicare IM Given:    Additional Medicare Important Message give by:     If discussed at Long Length of Stay Meetings, dates discussed:    Additional Comments:  Lawerance SabalDebbie Kawhi Diebold, RN 06/23/2015, 11:45 AM

## 2015-06-23 NOTE — Discharge Summary (Signed)
Physician Discharge Summary   Patient ID: Michael [name] MRN: [id number] DOB/AGE: April 30, 1970 45 y.o.  Admit date: 06/17/2015 Discharge date: 06/23/2015  Primary Care Physician:  No PCP Per Patient  Discharge Diagnoses:    . Liver failure (HCC) . Acute alcoholic hepatitis . Coagulopathy (HCC) . Macrocytic anemiaDue to folate deficiency  . ARF (acute renal failure) (HCC) . Ascites  Consults: Gastroenterology  Recommendations for Outpatient Follow-up:  1. Please repeat CBC/CMET at next visit  2. patient was placed on prednisolone 40 mg daily, continuing to taper by gastroenterology in office after 2 weeks   DIET:Low-sodium diet   Allergies:  No Known Allergies   DISCHARGE MEDICATIONS: Current Discharge Medication List    START taking these medications   Details  clonazePAM (KLONOPIN) 0.5 MG tablet Take 1 tablet (0.5 mg total) by mouth 2 (two) times daily as needed for anxiety. Qty: 30 tablet, Refills: 0    folic acid (FOLVITE) 1 MG tablet Take 1 tablet (1 mg total) by mouth daily. Qty: 30 tablet, Refills: 3    furosemide (LASIX) 40 MG tablet Take 1 tablet (40 mg total) by mouth daily. Qty: 30 tablet, Refills: 3    hydrocortisone (ANUSOL-HC) 2.5 % rectal cream Place rectally 4 (four) times daily. Qty: 30 g, Refills: 3    hydrocortisone (ANUSOL-HC) 25 MG suppository Place 1 suppository (25 mg total) rectally 2 (two) times daily. Qty: 24 suppository, Refills: 3    hydrocortisone cream 1 % Apply topically 2 (two) times daily. Back rash for 1 week Qty: 30 g, Refills: 0    lactulose (CHRONULAC) 10 GM/15ML solution Take 15 mLs (10 g total) by mouth 2 (two) times daily. Qty: 240 mL, Refills: 3    pantoprazole (PROTONIX) 40 MG tablet Take 1 tablet (40 mg total) by mouth daily. Qty: 30 tablet, Refills: 3    prednisoLONE 5 MG TABS tablet Take 8 tablets (40 mg total) by mouth daily. Will be tapered by the GI office Qty: 200 each, Refills: 3    spironolactone (ALDACTONE)  50 MG tablet Take 1 tablet (50 mg total) by mouth 2 (two) times daily. Qty: 60 tablet, Refills: 3    thiamine 100 MG tablet Take 1 tablet (100 mg total) by mouth daily. Qty: 30 tablet, Refills: 3      CONTINUE these medications which have CHANGED   Details  bacitracin ointment Apply 1 application topically 2 (two) times daily. Qty: 120 g, Refills: 0      CONTINUE these medications which have NOT CHANGED   Details  polyethylene glycol (MIRALAX / GLYCOLAX) packet Take 17 g by mouth daily as needed for mild constipation.      STOP taking these medications     guaiFENesin (MUCINEX) 600 MG 12 hr tablet      aspirin EC 325 MG tablet      ibuprofen (ADVIL,MOTRIN) 800 MG tablet      oxyCODONE-acetaminophen (PERCOCET/ROXICET) 5-325 MG per tablet      oxyCODONE-acetaminophen (ROXICET) 5-325 MG per tablet          Brief H and P: For complete details please refer to admission H and P, but in brief *Michael [name] is a 45 y.o. male with medical history significant of alcoholism presents to the ER because of increasing jaundice and abdominal distention. Patient states over the last 3 weeks patient was noticing increasing abdominal distention and lower extremity edema and family noticed patient having jaundice. Imaging showed liver cirrhosis. Underwent paracentesis. GI and gen surg consulted.  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 Paracentesis done again on 5/24, 3.5 L removed. Patient was placed on empiric ceftriaxone for SBP coverage -Gastroenterology consulted, started patient on Trental and prednisolone. Patient would not be eligible for liver transplant until he is 6 month sober (without alcohol) -MELD score 28, DIF 65 -Hepatitis panel: Hep A total + (likely vaccination vs prior exposure), hep A IgM negative, Hep B/C nonreactive - Continue Lasix,  Aldactone. Added lactulose 10 mg twice daily -GI now recommends continuing prednisolone 40 mg daily for 4 weeks, then tapering off over a two-week period. Trental discontinued. - Total bilirubin improving, 13.9 today   Coagulopathy -Secondary to the above, INR 1.59 -Vitamin K has been ordered upon admission -Continue to monitor INR  Macrocytic anemia, folate deficiency -Anemia panel: Iron 31, ferritin 137, TIBC 176 - Folate down to 4.6, B12 within normal limits, Continue folic acid replacement  Acute kidney injury -Creatinine 2016 0.99, rose to 1.58, improved to normal  Hyponatremia due to liver cirrhosis and volume overload -Urine osmolality 392, urine sodium less than 10 -Sodium 126 -Continue to monitor CMP  Alcohol and tobacco abuse -Continue CIWA pro with Ativan tocol -Discussed support groups and AA, declined social work consult  Gallbladder sludge/thickening -Noted on [place] -General surgery consulted - could order HIDA to assess for cholecystitis but tracer may not be taken up  Hypokalemia Replace  Leukocytosis -Likely secondary to steroids -No source of infections, although patient is on ceftriaxone empirically   Day of Discharge BP 117/76 mmHg  Pulse 92  Temp(Src) 98.1 F (36.7 C) (Oral)  Resp 18  Ht  (1.753 m)  Wt 66.5 kg (146 lb 9.7 oz)  BMI 21.64 kg/m2  SpO2 97%  Physical Exam: General: Alert and awake oriented x3 not in any acute distress. HEENT: anicteric sclera, pupils reactive to light and accommodation CVS: S1-S2 clear no murmur rubs or gallops Chest: clear to auscultation bilaterally, no wheezing rales or rhonchi Abdomen: soft , distended with ascites, normal bowel sounds Extremities: no cyanosis, clubbing or edema noted bilaterally Neuro: Cranial nerves II-XII intact, no focal neurological deficits   The results of significant diagnostics from this hospitalization (including imaging, microbiology, ancillary and laboratory) are listed below  for reference.    LAB RESULTS: Basic Metabolic Panel:  Recent Labs Lab 06/22/15 0537 06/23/15 0640  NA 126* 128*  K 3.4* 3.2*  CL 95* 97*  CO2 22 22  GLUCOSE 91 122*  BUN 27* 24*  CREATININE 1.02 1.01  CALCIUM 8.2* 8.2*   Liver Function Tests:  Recent Labs Lab 06/22/15 0537 06/23/15 0640  AST 151* 170*  ALT 46 51  ALKPHOS 224* 222*  BILITOT 16.9* 13.9*  PROT 5.7* 5.8*  ALBUMIN 1.7* 1.6*    Recent Labs Lab 06/17/15 1540  LIPASE 69*    Recent Labs Lab 06/17/15 2126  AMMONIA 55*   CBC:  Recent Labs Lab 06/18/15 0644  06/21/15 0429 06/22/15 0537  WBC 15.8*  < > 18.4* 18.9*  NEUTROABS 13.5*  --   --   --   HGB 12.0*  < > 12.1* 11.5*  HCT 34.3*  < > 34.6* 33.7*  MCV 101.8*  < > 102.1* 102.4*  PLT 274  < > 284 276  < > = values in this interval not displayed. Cardiac Enzymes: No results for input(s): CKTOTAL, CKMB, CKMBINDEX, TROPONINI in the last 168 hours. BNP: Invalid  input(s): POCBNP CBG:  Recent Labs Lab 06/19/15 1834  GLUCAP 135*    Significant Diagnostic Studies:  Dg Chest 2 View  06/17/2015  CLINICAL DATA:  Shortness of breath EXAM: CHEST  2 VIEW COMPARISON:  07/13/2014 chest radiograph. FINDINGS: Stable cardiomediastinal silhouette with normal heart size. No pneumothorax. No pleural effusion. Minimal scarring versus atelectasis at the anterior left lung base. No pulmonary edema. No acute consolidative airspace disease. IMPRESSION: Minimal scarring versus atelectasis at the anterior left lung base. Otherwise no active disease in the chest. Electronically Signed   By: Delbert PhenixJason A Poff M.D.   On: 06/17/2015 19:29   [place] Abdomen Complete  06/17/2015  CLINICAL DATA:  Abdominal pain and distention.  Alcohol abuse. EXAM: ABDOMEN ULTRASOUND COMPLETE COMPARISON:  None. FINDINGS: Gallbladder: Nondistended gallbladder contains several tiny echogenic non shadowing foci in the dependent gallbladder measuring up to 5 mm in size, probably sludge balls or tiny  non shadowing gallstones. Mild diffuse gallbladder wall thickening. No pericholecystic fluid or sonographic Murphy sign. Common bile duct: Diameter: 4 mm Liver: Liver parenchyma is diffusely coarsened and echogenic, there is posterior acoustic attenuation in the liver and the liver surface appears slightly irregular, suggesting cirrhosis in superimposed steatosis. No liver mass detected, noting decreased sensitivity in the setting of an echogenic liver. IVC: No abnormality visualized. Pancreas: Poorly visualized due to overlying bowel gas. Spleen: Size and appearance within normal limits more (craniocaudal splenic length 12.1 cm). Right Kidney: Length: 11.4 cm. Echogenicity within normal limits. No mass or hydronephrosis visualized. Left Kidney: Length: 10.3 cm. Echogenicity within normal limits. No mass or hydronephrosis visualized. Abdominal aorta: No aneurysm visualized. Other findings: Moderate volume ascites. IMPRESSION: 1. Sonographic findings are suggestive of cirrhosis and superimposed hepatic steatosis. No liver mass detected. 2. Moderate volume ascites.  Top-normal size spleen. 3. Tiny sludge balls versus non shadowing gallstones in the nondistended gallbladder. Mild diffuse gallbladder wall thickening without sonographic Murphy's sign, probably reactive or noninflammatory gallbladder wall edema. Electronically Signed   By: Delbert PhenixJason A Poff M.D.   On: 06/17/2015 20:28   [place] Paracentesis  06/18/2015  INDICATION: Jaundice; ascites EXAM: ULTRASOUND-GUIDED PARACENTESIS COMPARISON:  None. MEDICATIONS: 10 cc 1% lidocaine COMPLICATIONS: None immediate. TECHNIQUE: Informed written consent was obtained from the patient after a discussion of the risks, benefits and alternatives to treatment. A timeout was performed prior to the initiation of the procedure. Initial ultrasound scanning demonstrates a large amount of ascites within the right lower abdominal quadrant. The right lower abdomen was prepped and draped in the  usual sterile fashion. 1% lidocaine with epinephrine was used for local anesthesia. Under direct ultrasound guidance, a 19 gauge, 7-cm, Yueh catheter was introduced. An ultrasound image was saved for documentation purposed. The paracentesis was performed. The catheter was removed and a dressing was applied. The patient tolerated the procedure well without immediate post procedural complication. FINDINGS: A total of approximately 3 liters of dark yellow fluid was removed. Samples were sent to the laboratory as requested by the clinical team. IMPRESSION: Successful ultrasound-guided paracentesis yielding 3 liters of peritoneal fluid. Read by:  Robet LeuPamela A [facility] Electronically Signed   By: Simonne ComeJohn  Watts M.D.   On: 06/18/2015 09:50    2D ECHO:   Disposition and Follow-up: Discharge Instructions    Diet - low sodium heart healthy    Complete by:  As directed      Discharge instructions    Complete by:  As directed   No alcohol  Please continue lactulose twice a day. If no  BM in 48hrs, take extra dose. If diarrhea, Hold lactulose     Increase activity slowly    Complete by:  As directed             DISPOSITION Home   DISCHARGE FOLLOW-UP Follow-up Information    Follow up with [facility] On 07/12/2015.   Specialty:  Internal Medicine   Why:  at 09:30  for your initial appointment. You may fill your meds at [facility] after discharge from hospital. Your next MD appointment will be at [facility].    Contact information:   [address] [place] [address] [phone]      Follow up with Shirley Friar., MD. Schedule an appointment as soon as possible for a visit in 2 weeks.   Specialty:  Gastroenterology   Why:  for hospital follow-up   Contact information:   [address] [place] [address] [phone]       Follow up with [facility].   Why:  home nurse to set up ist home visit in next 1-2  days. Will draw labs 06/30/15 and 07/07/15   Contact information:   [address] [place] [address] [phone]       Follow up with [facility].   Why:  To get Medications after discharge, and primary care.    Contact information:   [address] [place] [address] [phone]       Time spent on Discharge:  Signed:   Dakoda Laventure M.D. [facility] 06/23/2015, 12:28 PM Pager: [phone]

## 2015-06-28 NOTE — Progress Notes (Signed)
CM attempt to return call to "[name]" (not listed in patient contacts) at [phone]. No Answer, recording stated customer could not receive calls.

## 2015-07-01 MED FILL — LACTULOSE 10 GM/15 ML SOLN: 10 | 8 days supply | Qty: 240 | Fill #1

## 2015-07-06 MED FILL — predniSONE 5 MG TABS: 5 | 30 days supply | Qty: 240 | Fill #0

## 2015-07-12 ENCOUNTER — Telehealth: Payer: Self-pay | Admitting: *Deleted

## 2015-07-12 ENCOUNTER — Encounter: Payer: Self-pay | Admitting: Family Medicine

## 2015-07-12 ENCOUNTER — Ambulatory Visit (INDEPENDENT_AMBULATORY_CARE_PROVIDER_SITE_OTHER): Payer: Self-pay | Admitting: Family Medicine

## 2015-07-12 VITALS — BP 134/90 | HR 84 | Temp 98.4°F | Resp 16 | Ht 68.5 in | Wt 132.0 lb

## 2015-07-12 DIAGNOSIS — F411 Generalized anxiety disorder: Secondary | ICD-10-CM

## 2015-07-12 DIAGNOSIS — K7010 Alcoholic hepatitis without ascites: Secondary | ICD-10-CM

## 2015-07-12 DIAGNOSIS — D649 Anemia, unspecified: Secondary | ICD-10-CM

## 2015-07-12 LAB — CBC WITH DIFFERENTIAL/PLATELET
BASOS ABS: 0 {cells}/uL (ref 0–200)
Basophils Relative: 0 %
EOS PCT: 0 %
Eosinophils Absolute: 0 cells/uL — ABNORMAL LOW (ref 15–500)
HCT: 36.5 % — ABNORMAL LOW (ref 38.5–50.0)
Hemoglobin: 13.1 g/dL — ABNORMAL LOW (ref 13.2–17.1)
LYMPHS PCT: 4 %
Lymphs Abs: 928 cells/uL (ref 850–3900)
MCH: 36.5 pg — ABNORMAL HIGH (ref 27.0–33.0)
MCHC: 35.9 g/dL (ref 32.0–36.0)
MCV: 101.7 fL — AB (ref 80.0–100.0)
MONOS PCT: 6 %
MPV: 9.7 fL (ref 7.5–12.5)
Monocytes Absolute: 1392 cells/uL — ABNORMAL HIGH (ref 200–950)
NEUTROS ABS: 20880 {cells}/uL — AB (ref 1500–7800)
Neutrophils Relative %: 90 %
PLATELETS: 326 10*3/uL (ref 140–400)
RBC: 3.59 MIL/uL — ABNORMAL LOW (ref 4.20–5.80)
RDW: 13.7 % (ref 11.0–15.0)
WBC: 23.2 10*3/uL — ABNORMAL HIGH (ref 3.8–10.8)

## 2015-07-12 LAB — COMPLETE METABOLIC PANEL WITH GFR
ALK PHOS: 295 U/L — AB (ref 40–115)
ALT: 122 U/L — ABNORMAL HIGH (ref 9–46)
AST: 266 U/L — ABNORMAL HIGH (ref 10–40)
Albumin: 3.1 g/dL — ABNORMAL LOW (ref 3.6–5.1)
BILIRUBIN TOTAL: 8.4 mg/dL — AB (ref 0.2–1.2)
BUN: 21 mg/dL (ref 7–25)
CO2: 19 mmol/L — AB (ref 20–31)
CREATININE: 0.65 mg/dL (ref 0.60–1.35)
Calcium: 8.6 mg/dL (ref 8.6–10.3)
Chloride: 102 mmol/L (ref 98–110)
GLUCOSE: 109 mg/dL — AB (ref 65–99)
Potassium: 4.2 mmol/L (ref 3.5–5.3)
Sodium: 134 mmol/L — ABNORMAL LOW (ref 135–146)
TOTAL PROTEIN: 7.1 g/dL (ref 6.1–8.1)

## 2015-07-12 LAB — POCT URINALYSIS DIP (DEVICE)
Bilirubin Urine: NEGATIVE
GLUCOSE, UA: NEGATIVE mg/dL
Hgb urine dipstick: NEGATIVE
Ketones, ur: NEGATIVE mg/dL
Leukocytes, UA: NEGATIVE
NITRITE: NEGATIVE
PH: 6.5 (ref 5.0–8.0)
PROTEIN: NEGATIVE mg/dL
Specific Gravity, Urine: 1.015 (ref 1.005–1.030)
UROBILINOGEN UA: 0.2 mg/dL (ref 0.0–1.0)

## 2015-07-12 LAB — AMMONIA: AMMONIA: 91 umol/L — AB (ref 16–53)

## 2015-07-12 MED ORDER — LACTULOSE 10 GM/15ML PO SOLN: 10.0000 g | Freq: Two times a day (BID) | ORAL | Status: DC

## 2015-07-12 MED ORDER — FOLIC ACID 1 MG PO TABS: 1.0000 mg | ORAL_TABLET | Freq: Every day | ORAL | Status: DC

## 2015-07-12 MED ORDER — BUSPIRONE HCL 5 MG PO TABS: 5.0000 mg | ORAL_TABLET | Freq: Two times a day (BID) | ORAL | Status: DC

## 2015-07-12 MED ORDER — PANTOPRAZOLE SODIUM 40 MG PO TBEC: 40.0000 mg | DELAYED_RELEASE_TABLET | Freq: Every day | ORAL | Status: DC

## 2015-07-12 MED ORDER — SPIRONOLACTONE 50 MG PO TABS: 50.0000 mg | ORAL_TABLET | Freq: Two times a day (BID) | ORAL | Status: DC

## 2015-07-12 MED ORDER — THIAMINE HCL 100 MG PO TABS
100.0000 mg | ORAL_TABLET | Freq: Every day | ORAL | Status: DC
Start: 2015-07-12 — End: 2015-10-22

## 2015-07-12 MED ORDER — FUROSEMIDE 40 MG PO TABS: 40.0000 mg | ORAL_TABLET | Freq: Every day | ORAL | Status: DC

## 2015-07-12 MED ORDER — CLONAZEPAM 0.5 MG PO TABS: 0.5000 mg | ORAL_TABLET | Freq: Two times a day (BID) | ORAL | Status: DC | PRN

## 2015-07-12 MED FILL — busPIRone HCL 5 MG TABS: 5 | 30 days supply | Qty: 60 | Fill #0

## 2015-07-12 MED FILL — ?PANTOPRAZOLE SOD DR 40MG: 40 MG | 30 days supply | Qty: 30 | Fill #0

## 2015-07-12 MED FILL — ?SPIRONOLACTONE 50 MG TABLE: 50 | 30 days supply | Qty: 60 | Fill #0

## 2015-07-12 MED FILL — FUROSEMIDE 40 MG TABLET: 40 | 30 days supply | Qty: 30 | Fill #0

## 2015-07-12 MED FILL — FOLIC ACID 1 MG TABLET: 1 | 30 days supply | Qty: 30 | Fill #0

## 2015-07-12 MED FILL — LACTULOSE 10 GM/15 ML SOLN: 10 | 8 days supply | Qty: 240 | Fill #0 | Status: TO

## 2015-07-12 NOTE — Telephone Encounter (Signed)
Michael [name] from [facility] called and reported The ammonia level was high at 91.  PCP has been made aware of the result and patient was treated during visit.

## 2015-07-12 NOTE — Patient Instructions (Signed)
[facility]: Walk in clinic M- F 8 am to 3 pm [address] [place], [place]  Follow up with gastroenterology as scheduled.   Generalized Anxiety Disorder Generalized anxiety disorder (GAD) is a mental disorder. It interferes with life functions, including relationships, work, and school. GAD is different from normal anxiety, which everyone experiences at some point in their lives in response to specific life events and activities. Normal anxiety actually helps us prepare for and get through these life events and activities. Normal anxiety goes away after the event or activity is over.  GAD causes anxiety that is not necessarily related to specific events or activities. It also causes excess anxiety in proportion to specific events or activities. The anxiety associated with GAD is also difficult to control. GAD can vary from mild to severe. People with severe GAD can have intense waves of anxiety with physical symptoms (panic attacks).  SYMPTOMS The anxiety and worry associated with GAD are difficult to control. This anxiety and worry are related to many life events and activities and also occur more days than not for 6 months or longer. People with GAD also have three or more of the following symptoms (one or more in children):  Restlessness.   Fatigue.  Difficulty concentrating.   Irritability.  Muscle tension.  Difficulty sleeping or unsatisfying sleep. DIAGNOSIS GAD is diagnosed through an assessment by your health care provider. Your health care provider will ask you questions aboutyour mood,physical symptoms, and events in your life. Your health care provider may ask you about your medical history and use of alcohol or drugs, including prescription medicines. Your health care provider may also do a physical exam and blood tests. Certain medical conditions and the use of certain substances can cause symptoms similar to those associated with GAD. Your health care  provider may refer you to a mental health specialist for further evaluation. TREATMENT The following therapies are usually used to treat GAD:   Medication. Antidepressant medication usually is prescribed for long-term daily control. Antianxiety medicines may be added in severe cases, especially when panic attacks occur.   Talk therapy (psychotherapy). Certain types of talk therapy can be helpful in treating GAD by providing support, education, and guidance. A form of talk therapy called cognitive behavioral therapy can teach you healthy ways to think about and react to daily life events and activities.  Stress managementtechniques. These include yoga, meditation, and exercise and can be very helpful when they are practiced regularly. A mental health specialist can help determine which treatment is best for you. Some people see improvement with one therapy. However, other people require a combination of therapies.   This information is not intended to replace advice given to you by your health care provider. Make sure you discuss any questions you have with your health care provider.   Document Released: 05/12/2012 Document Revised: 02/05/2014 Document Reviewed: 05/12/2012 Elsevier Interactive Patient Education [employer]2016 Elsevier Inc.
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
## 2015-07-13 ENCOUNTER — Telehealth: Payer: Self-pay

## 2015-07-13 NOTE — Telephone Encounter (Signed)
Called and spoke with patient, he gave me permission to speak with Fiance [name]. Advised [name] of labs and for patient to continue lactulose as prescribed, limit protein intake to 100grams/day, limit sodium intake, and refrain from alcohol and tylenol. She verbalized instructions and will help patient follow directions. Thanks!

## 2015-07-13 NOTE — Telephone Encounter (Signed)
-----   Message from Massie MaroonLachina M Hollis, [place] sent at 07/13/2015  8:03 AM EDT ----- Regarding: lab resutls Please [facility]. Inform patient that ammonia level continues to be elevated, please continue lactulose as prescribed. All other labs are consistent with previous values. Limit protein intake to 100 grams/day. Also, limit sodium intake. Continue to refrain from alcohol intake or products containing acetaminophen. Please follow up as scheduled.   Thanks ----- Message -----    From: Lab in [facility] Interface    Sent: 07/12/2015   9:56 PM      To: Massie MaroonLachina M Hollis, FNP

## 2015-07-29 MED FILL — predniSONE 5 MG TABS: 5 | 30 days supply | Qty: 240 | Fill #1 | Status: TO

## 2015-08-05 ENCOUNTER — Telehealth: Payer: Self-pay

## 2015-08-05 ENCOUNTER — Other Ambulatory Visit: Payer: Self-pay

## 2015-08-05 DIAGNOSIS — K7010 Alcoholic hepatitis without ascites: Secondary | ICD-10-CM

## 2015-08-05 MED ORDER — LACTULOSE 10 GM/15ML PO SOLN: 10.0000 g | Freq: Two times a day (BID) | ORAL | Status: DC

## 2015-08-05 NOTE — Telephone Encounter (Signed)
Refill for lactulose sent into pharmacy. Thanks!

## 2015-08-05 NOTE — Telephone Encounter (Signed)
Expand All Collapse All   Michael QuinLinda, patient called stating that Neurontin which [name] stated last month is not helping completely. Patient states that it works a little throughout the day but in the evening the numbness and tingling returns. Please advise what patient should do he has an upcomming appointment for 09/06/2015. -LOV 07/12/2015 with [name]. Patient has authorized for us to call back girlfriend (stephanie) with any changes. [name]'s number [phone]. Please advise. Thanks!

## 2015-08-05 NOTE — Telephone Encounter (Signed)
Michael [name], patient called stating that Neurontin which [name] stated last month is not helping completely. Patient states that it works a little throughout the day but in the evening the numbness and tingling returns. Please advise what patient should do he has an upcomming appointment for 09/06/2015. -LOV 07/12/2015 with [name]. Patient has authorized for us to call back girlfriend (stephanie) with any changes. [name]'s number [phone]. Please advise. Thanks!

## 2015-08-05 NOTE — Telephone Encounter (Signed)
Please disregard this message °

## 2015-08-09 MED FILL — busPIRone HCL 5 MG TABS: 5 | 30 days supply | Qty: 60 | Fill #1

## 2015-08-18 ENCOUNTER — Other Ambulatory Visit: Payer: Self-pay

## 2015-08-18 ENCOUNTER — Encounter: Payer: Self-pay | Admitting: Family Medicine

## 2015-08-18 ENCOUNTER — Ambulatory Visit (INDEPENDENT_AMBULATORY_CARE_PROVIDER_SITE_OTHER): Payer: Self-pay | Admitting: Family Medicine

## 2015-08-18 VITALS — BP 130/90 | HR 96 | Temp 98.5°F | Resp 16 | Ht 68.5 in | Wt 148.0 lb

## 2015-08-18 DIAGNOSIS — K7460 Unspecified cirrhosis of liver: Secondary | ICD-10-CM

## 2015-08-18 DIAGNOSIS — F411 Generalized anxiety disorder: Secondary | ICD-10-CM

## 2015-08-18 DIAGNOSIS — K219 Gastro-esophageal reflux disease without esophagitis: Secondary | ICD-10-CM

## 2015-08-18 DIAGNOSIS — R188 Other ascites: Secondary | ICD-10-CM

## 2015-08-18 LAB — COMPREHENSIVE METABOLIC PANEL
ALBUMIN: 2.8 g/dL — AB (ref 3.6–5.1)
ALT: 77 U/L — ABNORMAL HIGH (ref 9–46)
AST: 79 U/L — ABNORMAL HIGH (ref 10–40)
Alkaline Phosphatase: 227 U/L — ABNORMAL HIGH (ref 40–115)
BUN: 17 mg/dL (ref 7–25)
CALCIUM: 8.6 mg/dL (ref 8.6–10.3)
CHLORIDE: 103 mmol/L (ref 98–110)
CO2: 24 mmol/L (ref 20–31)
Creat: 0.69 mg/dL (ref 0.60–1.35)
Glucose, Bld: 83 mg/dL (ref 65–99)
POTASSIUM: 4.9 mmol/L (ref 3.5–5.3)
SODIUM: 136 mmol/L (ref 135–146)
Total Bilirubin: 2.6 mg/dL — ABNORMAL HIGH (ref 0.2–1.2)
Total Protein: 5.9 g/dL — ABNORMAL LOW (ref 6.1–8.1)

## 2015-08-18 LAB — CBC WITH DIFFERENTIAL/PLATELET
BASOS ABS: 0 {cells}/uL (ref 0–200)
Basophils Relative: 0 %
EOS PCT: 0 %
Eosinophils Absolute: 0 cells/uL — ABNORMAL LOW (ref 15–500)
HEMATOCRIT: 36.7 % — AB (ref 38.5–50.0)
HEMOGLOBIN: 12.7 g/dL — AB (ref 13.2–17.1)
Lymphocytes Relative: 12 %
Lymphs Abs: 2208 cells/uL (ref 850–3900)
MCH: 35 pg — AB (ref 27.0–33.0)
MCHC: 34.6 g/dL (ref 32.0–36.0)
MCV: 101.1 fL — ABNORMAL HIGH (ref 80.0–100.0)
MPV: 8.5 fL (ref 7.5–12.5)
Monocytes Absolute: 1288 cells/uL — ABNORMAL HIGH (ref 200–950)
Monocytes Relative: 7 %
NEUTROS ABS: 14904 {cells}/uL — AB (ref 1500–7800)
Neutrophils Relative %: 81 %
Platelets: 164 10*3/uL (ref 140–400)
RBC: 3.63 MIL/uL — ABNORMAL LOW (ref 4.20–5.80)
RDW: 13.8 % (ref 11.0–15.0)
WBC: 18.4 10*3/uL — AB (ref 3.8–10.8)

## 2015-08-18 LAB — PROTIME-INR
INR: 1.2 — AB
Prothrombin Time: 12.7 s — ABNORMAL HIGH (ref 9.0–11.5)

## 2015-08-18 MED ORDER — BUSPIRONE HCL 5 MG PO TABS
5.0000 mg | ORAL_TABLET | Freq: Two times a day (BID) | ORAL | Status: DC
Start: 2015-08-18 — End: 2015-08-18

## 2015-08-18 MED ORDER — BUSPIRONE HCL 5 MG PO TABS: 5.0000 mg | ORAL_TABLET | Freq: Two times a day (BID) | ORAL | Status: DC

## 2015-08-18 MED FILL — FOLIC ACID 1 MG TABLET: 1 | 30 days supply | Qty: 30 | Fill #1 | Status: TO

## 2015-08-18 MED FILL — FUROSEMIDE 40 MG TABLET: 40 | 30 days supply | Qty: 30 | Fill #1

## 2015-08-18 MED FILL — ?PANTOPRAZOLE SOD DR 40MG: 40 MG | 30 days supply | Qty: 30 | Fill #1

## 2015-08-18 NOTE — Progress Notes (Signed)
Patient ID: Michael [name], male   DOB: 1970-05-25, 45 y.o.   MRN: [id number]   Michael [name], is a 45 y.o. male  [id number]  [id number]  DOB - 1970-05-25  CC:  Chief Complaint  Patient presents with  . Follow-up    anxiety. swelling in abdomen and ankles. abdominal pain.        HPI: Michael [name] is a 45 y.o. male here for follow-up on GAD. He was seen by Mrs. Hart RochesterHollis about a month ago and he was prescribed Buspar 5 and Klonipin. He has not filled the Klonipin. He reports not anxiety attacks in about 3 weeks. He would like a refill on Buspar. [facility]. He was recently hospitalized with cirrhohis with ascites, acute reneal failure, coagulopathy and anemia. He does have a history of alcohol abuse. He reports no alcohol in 10 weeks as well as no cigarettes in 10 weeks. He is being followed by Dr. Randa EvensEdwards at [facility] for his cirrhosis. He was seen yesterday. He brings a request from Dr. Randa EvensEdwards for CBC, Cmet and INR. His medications are listed in his med list. They will be managed by Dr. Randa EvensEdwards, except for the Buspar and/or Klopinin.  No Known Allergies Past Medical History  Diagnosis Date  . Anxiety    Current Outpatient Prescriptions on File Prior to Visit  Medication Sig Dispense Refill  . bacitracin ointment Apply 1 application topically 2 (two) times daily. 120 g 0  . folic acid (FOLVITE) 1 MG tablet Take 1 tablet (1 mg total) by mouth daily. 30 tablet 3  . furosemide (LASIX) 40 MG tablet Take 1 tablet (40 mg total) by mouth daily. 30 tablet 3  . lactulose (CHRONULAC) 10 GM/15ML solution Take 15 mLs (10 g total) by mouth 2 (two) times daily. 240 mL 3  . pantoprazole (PROTONIX) 40 MG tablet Take 1 tablet (40 mg total) by mouth daily. 30 tablet 3  . prednisoLONE 5 MG TABS tablet Take 8 tablets (40 mg total) by mouth daily. Will be tapered by the GI office (Patient taking differently: Take 30 mg by mouth daily. Will be tapered by the GI office)  200 each 3  . spironolactone (ALDACTONE) 50 MG tablet Take 1 tablet (50 mg total) by mouth 2 (two) times daily. 60 tablet 3  . thiamine 100 MG tablet Take 1 tablet (100 mg total) by mouth daily. 30 tablet 3  . clonazePAM (KLONOPIN) 0.5 MG tablet Take 1 tablet (0.5 mg total) by mouth 2 (two) times daily as needed for anxiety. (Patient not taking: Reported on 08/18/2015) 30 tablet 0   No current facility-administered medications on file prior to visit.   Family History  Problem Relation Age of Onset  . Prostate cancer Father   . Diabetes Mellitus II Paternal Grandfather    Social History   Social History  . Marital Status: Single    Spouse Name: N/A  . Number of Children: N/A  . Years of Education: N/A   Occupational History  . Not on file.   Social History Main Topics  . Smoking status: Former Games developermoker  . Smokeless tobacco: Never Used  . Alcohol Use: No     Comment: stoped in May 2017   . Drug Use: No  . Sexual Activity: Not on file   Other Topics Concern  . Not on file   Social History Narrative    Review of Systems: Constitutional: Negative for fever, chills, appetite change, weight loss,  Fatigue. Skin: He has had a recent skin rash which is resolving on prednisone HENT: Negative for ear pain, ear discharge.nose bleeds Eyes: Negative for pain, discharge, redness, itching and visual disturbance. Neck: Negative for pain, stiffness Respiratory: Negative for cough, shortness of breath,   Cardiovascular: Negative for chest pain, palpitations and leg swelling. Gastrointestinal: Negative for abdominal pain, nausea, vomiting, diarrhea, constipations. Positive for heartburn Genitourinary: Negative for dysuria, urgency, frequency, hematuria,  Musculoskeletal: Negative for back pain, joint pain, joint  swelling, and gait problem.Negative for weakness. Neurological: Negative for dizziness, tremors, seizures, syncope,   light-headedness, numbness and headaches.  Hematological:  Positive for easy bruising or bleeding Psychiatric/Behavioral: Positive for anxiety and mild depression. No suicidal ideations   Objective:   Filed Vitals:   08/18/15 0809  BP: 130/90  Pulse: 96  Temp: 98.5 F (36.9 C)  Resp: 16    Physical Exam: Constitutional: Patient appears well-developed and well-nourished. No distress. HENT: Normocephalic, atraumatic, External right and left ear normal. Oropharynx is clear and moist.  Eyes: Conjunctivae and EOM are normal. PERRLA. There is minimal  scleral icterus. Neck: Normal ROM. Neck supple. No lymphadenopathy, No thyromegaly. CVS: RRR, S1/S2 +, no murmurs, no gallops, no rubs Pulmonary: Effort and breath sounds normal, no stridor, rhonchi, wheezes, rales.  Abdominal: Soft. Normoactive BS,, no distension, tenderness, rebound or guarding.  Musculoskeletal: Normal range of motion. No edema and no tenderness.  Neuro: Alert.Normal muscle tone coordination. Non-focal Skin: Skin is warm and dry. No rash noted. Not diaphoretic. No erythema. No pallor. Psychiatric: Normal mood and affect. Behavior, judgment, thought content normal.  Lab Results  Component Value Date   WBC 23.2* 07/12/2015   HGB 13.1* 07/12/2015   HCT 36.5* 07/12/2015   MCV 101.7* 07/12/2015   PLT 326 07/12/2015   Lab Results  Component Value Date   CREATININE 0.65 07/12/2015   BUN 21 07/12/2015   NA 134* 07/12/2015   K 4.2 07/12/2015   CL 102 07/12/2015   CO2 19* 07/12/2015    No results found for: HGBA1C Lipid Panel  No results found for: CHOL, TRIG, HDL, CHOLHDL, VLDL, LDLCALC     Assessment and plan:   1. Generalized anxiety disorder  - busPIRone (BUSPAR) 5 MG tablet; Take 1 tablet (5 mg total) by mouth 2 (two) times daily.  Dispense: 60 tablet; Refill: 1  2. Cirrhosis of liver with ascites, unspecified hepatic cirrhosis type (HCC)  - CBC with Differential - Comprehensive metabolic panel - HIV antibody (with reflex) - Protime-INR   Return in about  3 months (around 11/18/2015).  The patient was given clear instructions to go to ER or return to medical center if symptoms don't improve, worsen or new problems develop. The patient verbalized understanding.    Henrietta Hoover FNP  08/18/2015, 9:48 AM

## 2015-08-18 NOTE — Telephone Encounter (Signed)
buspar sent into correct pharmacy. Thanks!

## 2015-08-18 NOTE — Patient Instructions (Signed)
Call Dr. Randa EvensEdwards office about medication questions other than Buspar and/or Klonipin for anxiety. See what immunizations if recommends if any other than fluenza. Congratulation on your smoking and alcohol cessation.

## 2015-08-19 LAB — HIV ANTIBODY (ROUTINE TESTING W REFLEX): HIV: NONREACTIVE

## 2015-08-22 MED FILL — ?SPIRONOLACTONE 50 MG TABLE: 50 | 30 days supply | Qty: 60 | Fill #1

## 2015-08-26 ENCOUNTER — Other Ambulatory Visit (HOSPITAL_COMMUNITY): Payer: Self-pay | Admitting: Gastroenterology

## 2015-08-26 DIAGNOSIS — K7011 Alcoholic hepatitis with ascites: Secondary | ICD-10-CM

## 2015-08-27 ENCOUNTER — Emergency Department (HOSPITAL_COMMUNITY): Payer: Medicaid Other

## 2015-08-27 ENCOUNTER — Emergency Department (HOSPITAL_COMMUNITY)
Admission: EM | Admit: 2015-08-27 | Discharge: 2015-08-27 | Disposition: A | Discharge status: Home / Self Care | Payer: Medicaid Other | Attending: Emergency Medicine | Admitting: Emergency Medicine

## 2015-08-27 ENCOUNTER — Encounter (HOSPITAL_COMMUNITY): Payer: Self-pay

## 2015-08-27 DIAGNOSIS — R918 Other nonspecific abnormal finding of lung field: Secondary | ICD-10-CM

## 2015-08-27 DIAGNOSIS — R1909 Other intra-abdominal and pelvic swelling, mass and lump: Secondary | ICD-10-CM | POA: Insufficient documentation

## 2015-08-27 DIAGNOSIS — R911 Solitary pulmonary nodule: Secondary | ICD-10-CM | POA: Diagnosis not present

## 2015-08-27 DIAGNOSIS — Z87891 Personal history of nicotine dependence: Secondary | ICD-10-CM | POA: Diagnosis not present

## 2015-08-27 DIAGNOSIS — R0600 Dyspnea, unspecified: Secondary | ICD-10-CM | POA: Diagnosis present

## 2015-08-27 DIAGNOSIS — K7031 Alcoholic cirrhosis of liver with ascites: Secondary | ICD-10-CM | POA: Diagnosis not present

## 2015-08-27 LAB — COMPREHENSIVE METABOLIC PANEL
ALT: 61 U/L (ref 17–63)
AST: 74 U/L — AB (ref 15–41)
Albumin: 2.2 g/dL — ABNORMAL LOW (ref 3.5–5.0)
Alkaline Phosphatase: 198 U/L — ABNORMAL HIGH (ref 38–126)
Anion gap: 9 (ref 5–15)
BILIRUBIN TOTAL: 3.3 mg/dL — AB (ref 0.3–1.2)
BUN: 12 mg/dL (ref 6–20)
CALCIUM: 8.3 mg/dL — AB (ref 8.9–10.3)
CO2: 21 mmol/L — ABNORMAL LOW (ref 22–32)
CREATININE: 0.86 mg/dL (ref 0.61–1.24)
Chloride: 98 mmol/L — ABNORMAL LOW (ref 101–111)
Glucose, Bld: 135 mg/dL — ABNORMAL HIGH (ref 65–99)
Potassium: 3.6 mmol/L (ref 3.5–5.1)
Sodium: 128 mmol/L — ABNORMAL LOW (ref 135–145)
TOTAL PROTEIN: 5.8 g/dL — AB (ref 6.5–8.1)

## 2015-08-27 LAB — CBC WITH DIFFERENTIAL/PLATELET
BASOS ABS: 0 10*3/uL (ref 0.0–0.1)
Basophils Relative: 0 %
Eosinophils Absolute: 0 10*3/uL (ref 0.0–0.7)
Eosinophils Relative: 0 %
HEMATOCRIT: 36.1 % — AB (ref 39.0–52.0)
Hemoglobin: 12.4 g/dL — ABNORMAL LOW (ref 13.0–17.0)
LYMPHS ABS: 0.6 10*3/uL — AB (ref 0.7–4.0)
LYMPHS PCT: 3 %
MCH: 34.6 pg — AB (ref 26.0–34.0)
MCHC: 34.3 g/dL (ref 30.0–36.0)
MCV: 100.8 fL — AB (ref 78.0–100.0)
MONO ABS: 1.4 10*3/uL — AB (ref 0.1–1.0)
MONOS PCT: 7 %
NEUTROS ABS: 17.2 10*3/uL — AB (ref 1.7–7.7)
Neutrophils Relative %: 90 %
Platelets: 196 10*3/uL (ref 150–400)
RBC: 3.58 MIL/uL — ABNORMAL LOW (ref 4.22–5.81)
RDW: 13.7 % (ref 11.5–15.5)
WBC: 19.2 10*3/uL — ABNORMAL HIGH (ref 4.0–10.5)

## 2015-08-27 MED ORDER — IOPAMIDOL (ISOVUE-300) INJECTION 61%
100.0000 mL | Freq: Once | INTRAVENOUS | Status: AC | PRN
  Administered 2015-08-27: 100 mL via INTRAVENOUS

## 2015-08-27 MED ORDER — DIATRIZOATE MEGLUMINE & SODIUM 66-10 % PO SOLN
ORAL | Status: AC
  Filled 2015-08-27: qty 30

## 2015-08-27 MED ORDER — LIDOCAINE HCL (PF) 1 % IJ SOLN
INTRAMUSCULAR | Status: AC
  Filled 2015-08-27: qty 10

## 2015-08-27 NOTE — ED Notes (Signed)
Patient transported to Ultrasound 

## 2015-08-27 NOTE — ED Notes (Signed)
Patient returned from ultrasound and CT

## 2015-08-27 NOTE — ED Triage Notes (Signed)
Patient here with abdominal distention and shortness of breath since Tuesday. Has liver disease and thinks he needs paracentesis. Speaking complete sentences

## 2015-08-27 NOTE — ED Notes (Signed)
Patient Alert and oriented X4. Stable and ambulatory. Patient verbalized understanding of the discharge instructions.  Patient belongings were taken by the patient.  

## 2015-08-27 NOTE — ED Notes (Signed)
CT transport came to get patient who is still in ultrasound.  Transport going to ultrasound to get patient for CT scans.

## 2015-08-27 NOTE — Procedures (Signed)
Ultrasound-guided therapeutic paracentesis performed yielding 4.2 liters of turbid, yellow fluid. No immediate complications.

## 2015-08-27 NOTE — ED Provider Notes (Signed)
[facility]-EMERGENCY DEPT Provider Note   CSN: [id number] Arrival date & time: 08/27/15  1002  First Provider Contact:  First MD Initiated Contact with Patient 08/27/15 1307        History   Chief Complaint No chief complaint on file.   HPI Michael [name] is a 45 y.o. male presenting with abdominal distention and dyspnea. Over the past 1-1/2 weeks he has had recurrent fluid on his abdomen. He has a history of alcoholic cirrhosis with ascites. Denies pain, only distention. However today he has noticed shortness of breath while walking. Is okay at rest. No chest pain or cough. No fevers. Last had a paracentesis on 06/22/15. Had multiple paracenteses during last admission in May. Besides the fluid they took off, no weight loss. Has been taking lasix as prescribed.  HPI  Past Medical History:  Diagnosis Date  . Anxiety     Patient Active Problem List   Diagnosis Date Noted  . Liver failure without hepatic coma (HCC)   . Liver failure (HCC) 06/17/2015  . Alcoholic cirrhosis of liver with ascites (HCC) 06/17/2015  . Acute alcoholic hepatitis 06/17/2015  . Coagulopathy (HCC) 06/17/2015  . Macrocytic anemia 06/17/2015  . ARF (acute renal failure) (HCC) 06/17/2015  . Ascites 06/17/2015    Past Surgical History:  Procedure Laterality Date  . NO PAST SURGERIES     SURGERY AS INFANT PYLORIC STENOSIS  . OPEN REDUCTION INTERNAL FIXATION (ORIF) FOOT LISFRANC FRACTURE Right 08/06/2014   Procedure: RIGHT FOOT FRACTURE AND LISFRANC LIGAMENT INJURY FIXATION AND SUTURE REMOVAL LEFT WRIST INCISION;  Surgeon: Nadara Mustard, MD;  Location: [facility] OR;  Service: Orthopedics;  Laterality: Right;  . ORIF WRIST FRACTURE Left 07/20/2014   Procedure: OPEN REDUCTION INTERNAL FIXATION (ORIF) WRIST FRACTURE;  Surgeon: Cammy Copa, MD;  Location: [facility] OR;  Service: Orthopedics;  Laterality: Left;  . SMALL INTESTINE SURGERY     as an infant       Home Medications    Prior to Admission medications     Medication Sig Start Date End Date Taking? Authorizing Provider  busPIRone (BUSPAR) 5 MG tablet Take 1 tablet (5 mg total) by mouth 2 (two) times daily. 08/18/15  Yes Henrietta Hoover, NP  folic acid (FOLVITE) 1 MG tablet Take 1 tablet (1 mg total) by mouth daily. 07/12/15  Yes Massie Maroon, FNP  furosemide (LASIX) 40 MG tablet Take 1 tablet (40 mg total) by mouth daily. Patient taking differently: Take 40 mg by mouth 2 (two) times daily.  07/12/15  Yes Massie Maroon, FNP  lactulose (CHRONULAC) 10 GM/15ML solution Take 15 mLs (10 g total) by mouth 2 (two) times daily. 08/05/15  Yes Massie Maroon, FNP  omeprazole (PRILOSEC) 20 MG capsule Take 20 mg by mouth 2 (two) times daily before a meal.   Yes Historical Provider, MD  pantoprazole (PROTONIX) 40 MG tablet Take 1 tablet (40 mg total) by mouth daily. 07/12/15  Yes Massie Maroon, FNP  predniSONE (DELTASONE) 5 MG tablet Take 5-40 mg by mouth daily with breakfast. TAPERING DOWN DOSAGE EACH WEEK.   Yes Historical Provider, MD  spironolactone (ALDACTONE) 50 MG tablet Take 1 tablet (50 mg total) by mouth 2 (two) times daily. 07/12/15  Yes Massie Maroon, FNP  thiamine 100 MG tablet Take 1 tablet (100 mg total) by mouth daily. 07/12/15  Yes Massie Maroon, FNP  bacitracin ointment Apply 1 application topically 2 (two) times daily. Patient not taking: Reported on 08/27/2015 06/23/15  Ripudeep Jenna Luo, MD  busPIRone (BUSPAR) 5 MG tablet Take 1 tablet (5 mg total) by mouth 2 (two) times daily. Patient not taking: Reported on 08/27/2015 08/18/15   Henrietta Hoover, NP  clonazePAM (KLONOPIN) 0.5 MG tablet Take 1 tablet (0.5 mg total) by mouth 2 (two) times daily as needed for anxiety. Patient not taking: Reported on 08/18/2015 07/12/15   Massie Maroon, FNP  prednisoLONE 5 MG TABS tablet Take 8 tablets (40 mg total) by mouth daily. Will be tapered by the GI office Patient not taking: Reported on 08/27/2015 06/23/15   Ripudeep Jenna Luo, MD    Family  History Family History  Problem Relation Age of Onset  . Prostate cancer Father   . Diabetes Mellitus II Paternal Grandfather     Social History Social History  Substance Use Topics  . Smoking status: Former Games developer  . Smokeless tobacco: Never Used  . Alcohol use No     Comment: stoped in May 2017      Allergies   Review of patient's allergies indicates no known allergies.   Review of Systems Review of Systems  Respiratory: Positive for shortness of breath. Negative for cough.   Cardiovascular: Negative for chest pain.  Gastrointestinal: Positive for abdominal distention. Negative for abdominal pain.  All other systems reviewed and are negative.    Physical Exam Updated Vital Signs BP 120/85   Pulse 102   Temp 98.1 F (36.7 C) (Oral)   Resp 24   Wt 156 lb 7 oz (71 kg)   SpO2 100%   BMI 23.44 kg/m   Physical Exam  Constitutional: He is oriented to person, place, and time. He appears well-developed and well-nourished. No distress.  HENT:  Head: Normocephalic and atraumatic.  Right Ear: External ear normal.  Left Ear: External ear normal.  Nose: Nose normal.  Eyes: Right eye exhibits no discharge. Left eye exhibits no discharge.  Neck: Neck supple.  Cardiovascular: Regular rhythm, normal heart sounds and intact distal pulses.  Tachycardia present.   HR low 100s  Pulmonary/Chest: Effort normal and breath sounds normal. He has no wheezes. He has no rales.  Abdominal: Soft. He exhibits distension (distention c/w ascites). There is no tenderness.  Musculoskeletal: He exhibits no edema.  Neurological: He is alert and oriented to person, place, and time.  Skin: Skin is warm and dry. He is not diaphoretic.  Nursing note and vitals reviewed.    ED Treatments / Results  Labs (all labs ordered are listed, but only abnormal results are displayed) Labs Reviewed  CBC WITH DIFFERENTIAL/PLATELET - Abnormal; Notable for the following:       Result Value   WBC 19.2 (*)     RBC 3.58 (*)    Hemoglobin 12.4 (*)    HCT 36.1 (*)    MCV 100.8 (*)    MCH 34.6 (*)    Neutro Abs 17.2 (*)    Lymphs Abs 0.6 (*)    Monocytes Absolute 1.4 (*)    All other components within normal limits  COMPREHENSIVE METABOLIC PANEL - Abnormal; Notable for the following:    Sodium 128 (*)    Chloride 98 (*)    CO2 21 (*)    Glucose, Bld 135 (*)    Calcium 8.3 (*)    Total Protein 5.8 (*)    Albumin 2.2 (*)    AST 74 (*)    Alkaline Phosphatase 198 (*)    Total Bilirubin 3.3 (*)    All  other components within normal limits    EKG  EKG Interpretation  Date/Time:  Saturday August 27 2015 10:27:54 EDT Ventricular Rate:  112 PR Interval:  112 QRS Duration: 80 QT Interval:  328 QTC Calculation: 447 R Axis:   -10 Text Interpretation:  Sinus tachycardia Left ventricular hypertrophy Inferior infarct , age undetermined Abnormal ECG No old tracing to compare Confirmed by Shanara Schnieders MD, Delpha Perko [id number]) on 08/27/2015 1:25:37 PM       Radiology Dg Chest 2 View  Result Date: 08/27/2015 CLINICAL DATA:  Abdominal distention, shortness of breath. EXAM: CHEST  2 VIEW COMPARISON:  06/17/2015 FINDINGS: There are numerous bilateral nodular densities in the lungs which appear new since prior study. This is concerning for possible metastatic disease. Heart is normal size. No effusions. No acute bony abnormality. IMPRESSION: Numerous bilateral nodular densities, new since prior study. Cannot exclude metastatic disease. Recommend further evaluation with chest CT with IV contrast. Electronically Signed   By: Charlett Nose M.D.   On: 08/27/2015 11:12  Ct Chest W Contrast  Result Date: 08/27/2015 CLINICAL DATA:  Pulmonary lesions noted on recent chest x-ray. Evaluate for metastatic disease. EXAM: CT CHEST, ABDOMEN, AND PELVIS WITH CONTRAST TECHNIQUE: Multidetector CT imaging of the chest, abdomen and pelvis was performed following the standard protocol during bolus administration of intravenous  contrast. CONTRAST:  ISOVUE-300 IOPAMIDOL (ISOVUE-300) INJECTION 61% COMPARISON:  Chest x-ray 08/27/2015 FINDINGS: CT CHEST FINDINGS Mediastinum/Lymph Nodes: The chest wall is unremarkable. No chest wall mass, supraclavicular or axillary lymphadenopathy. Small scattered lymph nodes are noted. The thyroid gland is not imaged. The visualized isthmic portion appears normal. The heart is normal in size. No pericardial effusion. The aorta is normal in caliber. No atherosclerotic calcifications. The branch vessels are patent. Coronary artery calcifications are noted. The esophagus is grossly normal. Borderline mediastinal lymph nodes. Scattered small prevascular nodes. The largest measures 5 mm on image 18. Pretracheal lymph node on image number 15 measures 8 mm. Subcarinal lymph node on image number 23 measures 10 mm. No hilar adenopathy. Lungs/Pleura: Extensive pulmonary metastatic disease with numerous lesions of varying size. Some of these appears slightly cavitated. No edema or infiltrates. There is a small left pleural effusion with overlying atelectasis. Musculoskeletal: No significant bony findings. No definite findings for osseous metastatic disease. CT ABDOMEN PELVIS FINDINGS Hepatobiliary: Diffuse fatty infiltration of the liver along with cirrhotic changes but no focal hepatic lesion to suggest hepatoma and no evidence of metastatic disease. The portal and hepatic veins are patent. The gallbladder is mildly distended. Small nodules are suspected which could be polyps. No common bile duct dilatation. Pancreas: No mass, inflammation or ductal dilatation. Spleen: Normal size.  No focal lesions. Adrenals/Urinary Tract: The adrenal glands and kidneys are unremarkable. Stomach/Bowel: The stomach, duodenum, small bowel and colon are grossly normal. No inflammatory changes, mass lesions or obstructive findings. The terminal ileum appears normal. The appendix is normal. Vascular/Lymphatic: The aorta and branch  vessels are patent. The major venous structures are patent. There are small scattered mesenteric and retroperitoneal mass or overt adenopathy. Reproductive: The bladder, prostate gland and seminal vesicles are unremarkable. Other: There is portal venous hypertension with extensive portal venous collaterals and esophageal varices. There is also moderate volume abdominal/ pelvic ascites. I do not see an obvious omental mass or peritoneal surface disease. Musculoskeletal: No significant bony findings. I do not see any obvious metastatic bone disease IMPRESSION: 1. Numerous pulmonary nodules could suggest metastatic disease. There is mild inflammation around nodules and some of  them demonstrate cavitation or air bronchograms. This could be metastatic disease or septic emboli or atypical infection such as candidiasis. Recommend pulmonary consultation. If this is metastatic disease I do not see an obvious source. 2. Small left pleural effusion with overlying atelectasis. 3. Small scattered mediastinal and hilar lymph nodes. 4. Cirrhosis with portal venous hypertension, portal venous collaterals, esophageal varices and ascites. 5. No hepatic lesions to suggest HCC or metastasis. 6. No definite findings for osseous metastatic disease. Electronically Signed   By: Rudie Meyer M.D.   On: 08/27/2015 17:03  Ct Abdomen Pelvis W Contrast  Result Date: 08/27/2015 CLINICAL DATA:  Pulmonary lesions noted on recent chest x-ray. Evaluate for metastatic disease. EXAM: CT CHEST, ABDOMEN, AND PELVIS WITH CONTRAST TECHNIQUE: Multidetector CT imaging of the chest, abdomen and pelvis was performed following the standard protocol during bolus administration of intravenous contrast. CONTRAST:  ISOVUE-300 IOPAMIDOL (ISOVUE-300) INJECTION 61% COMPARISON:  Chest x-ray 08/27/2015 FINDINGS: CT CHEST FINDINGS Mediastinum/Lymph Nodes: The chest wall is unremarkable. No chest wall mass, supraclavicular or axillary lymphadenopathy. Small  scattered lymph nodes are noted. The thyroid gland is not imaged. The visualized isthmic portion appears normal. The heart is normal in size. No pericardial effusion. The aorta is normal in caliber. No atherosclerotic calcifications. The branch vessels are patent. Coronary artery calcifications are noted. The esophagus is grossly normal. Borderline mediastinal lymph nodes. Scattered small prevascular nodes. The largest measures 5 mm on image 18. Pretracheal lymph node on image number 15 measures 8 mm. Subcarinal lymph node on image number 23 measures 10 mm. No hilar adenopathy. Lungs/Pleura: Extensive pulmonary metastatic disease with numerous lesions of varying size. Some of these appears slightly cavitated. No edema or infiltrates. There is a small left pleural effusion with overlying atelectasis. Musculoskeletal: No significant bony findings. No definite findings for osseous metastatic disease. CT ABDOMEN PELVIS FINDINGS Hepatobiliary: Diffuse fatty infiltration of the liver along with cirrhotic changes but no focal hepatic lesion to suggest hepatoma and no evidence of metastatic disease. The portal and hepatic veins are patent. The gallbladder is mildly distended. Small nodules are suspected which could be polyps. No common bile duct dilatation. Pancreas: No mass, inflammation or ductal dilatation. Spleen: Normal size.  No focal lesions. Adrenals/Urinary Tract: The adrenal glands and kidneys are unremarkable. Stomach/Bowel: The stomach, duodenum, small bowel and colon are grossly normal. No inflammatory changes, mass lesions or obstructive findings. The terminal ileum appears normal. The appendix is normal. Vascular/Lymphatic: The aorta and branch vessels are patent. The major venous structures are patent. There are small scattered mesenteric and retroperitoneal mass or overt adenopathy. Reproductive: The bladder, prostate gland and seminal vesicles are unremarkable. Other: There is portal venous hypertension  with extensive portal venous collaterals and esophageal varices. There is also moderate volume abdominal/ pelvic ascites. I do not see an obvious omental mass or peritoneal surface disease. Musculoskeletal: No significant bony findings. I do not see any obvious metastatic bone disease IMPRESSION: 1. Numerous pulmonary nodules could suggest metastatic disease. There is mild inflammation around nodules and some of them demonstrate cavitation or air bronchograms. This could be metastatic disease or septic emboli or atypical infection such as candidiasis. Recommend pulmonary consultation. If this is metastatic disease I do not see an obvious source. 2. Small left pleural effusion with overlying atelectasis. 3. Small scattered mediastinal and hilar lymph nodes. 4. Cirrhosis with portal venous hypertension, portal venous collaterals, esophageal varices and ascites. 5. No hepatic lesions to suggest HCC or metastasis. 6. No definite findings for  osseous metastatic disease. Electronically Signed   By: Rudie Meyer M.D.   On: 08/27/2015 17:03  US Paracentesis  Result Date: 08/27/2015 INDICATION: Alcoholic cirrhosis, recurrent ascites. Request made for therapeutic paracentesis. EXAM: ULTRASOUND GUIDED THERAPEUTIC PARACENTESIS MEDICATIONS: None. COMPLICATIONS: None immediate. PROCEDURE: Informed written consent was obtained from the patient after a discussion of the risks, benefits and alternatives to treatment. A timeout was performed prior to the initiation of the procedure. Initial ultrasound scanning demonstrates a moderate to large amount of ascites within the left lower abdominal quadrant. The left lower abdomen was prepped and draped in the usual sterile fashion. 1% lidocaine was used for local anesthesia. Following this, a Yueh catheter was introduced. An ultrasound image was saved for documentation purposes. The paracentesis was performed. The catheter was removed and a dressing was applied. The patient tolerated  the procedure well without immediate post procedural complication. FINDINGS: A total of approximately 4.2 liters of turbid, yellow fluid was removed. IMPRESSION: Successful ultrasound-guided therapeutic paracentesis yielding 4.2 liters of peritoneal fluid. Read by: Jeananne Rama, PA-C Electronically Signed   By: Richarda Overlie M.D.   On: 08/27/2015 15:43   Procedures Procedures (including critical care time)  Medications Ordered in ED Medications  diatrizoate meglumine-sodium (GASTROGRAFIN) 66-10 % solution (not administered)  lidocaine (PF) (XYLOCAINE) 1 % injection (not administered)  iopamidol (ISOVUE-300) 61 % injection 100 mL (100 mLs Intravenous Contrast Given 08/27/15 1602)     Initial Impression / Assessment and Plan / ED Course  I have reviewed the triage vital signs and the nursing notes.  Pertinent labs & imaging results that were available during my care of the patient were reviewed by me and considered in my medical decision making (see chart for details).  Clinical Course  Comment By Time  Spoke with radiology assistant, she will contact Dr. Lowella Dandy to see if he can do u/s paracentesis. Discussed nodules on CXR, will get CT chest/abd/pelvis to eval for possible cancer source. Pricilla Loveless, MD 07/29 1316  D/w Dr. Lowella Dandy, he will try to get one of his PA's to do the US Paracentesis. I think if CT's look ok and he feels better after fluid drainage he could go home Pricilla Loveless, MD 07/29 1358  Patient feels much better after paracentesis. Dyspnea gone. Waiting on CT results. Pricilla Loveless, MD 07/29 1624  Discussed CT with Dr. Katrinka Blazing, pulmonology. Recommends outpatient f/u with pulmonology this week, likely get FNA as outpatient. Concern for metastatic issue. Pricilla Loveless, MD 07/29 1715    Dyspnea resolved. Patient feels well and up to go home. No indication for admission. Discussed concerning findings on CT scans and the importance of following up with pulmonology this week. Discussed  strict return precautions. While he did have significant ascites he never had abdominal pain or fever. Chronic WBC elevation. Do not think he has SBP or the testing is needed  Final Clinical Impressions(s) / ED Diagnoses   Final diagnoses:  Ascites due to alcoholic cirrhosis [facility])  Pulmonary nodules    New Prescriptions New Prescriptions   No medications on file     Pricilla Loveless, MD 08/27/15 1726

## 2015-08-27 NOTE — ED Notes (Signed)
Report was received on the patient.  Patient is currently in ultrasound.  Will assess patient on return to department

## 2015-08-27 NOTE — Discharge Instructions (Signed)
It is important to follow-up with the lung (pulmonology) specialist this coming week. You will likely need a biopsy of one of these lung nodules (spots) to help rule out cancer. If you develop shortness of breath, cough, fevers, or other concerning symptoms return to the ER immediately.

## 2015-09-02 ENCOUNTER — Other Ambulatory Visit: Payer: Self-pay

## 2015-09-02 ENCOUNTER — Encounter: Payer: Self-pay | Admitting: Internal Medicine

## 2015-09-02 ENCOUNTER — Ambulatory Visit (INDEPENDENT_AMBULATORY_CARE_PROVIDER_SITE_OTHER): Payer: Medicaid Other | Admitting: Internal Medicine

## 2015-09-02 VITALS — BP 120/64 | HR 109 | Ht 68.5 in | Wt 154.6 lb

## 2015-09-02 DIAGNOSIS — R0600 Dyspnea, unspecified: Secondary | ICD-10-CM | POA: Insufficient documentation

## 2015-09-02 DIAGNOSIS — R918 Other nonspecific abnormal finding of lung field: Secondary | ICD-10-CM | POA: Diagnosis not present

## 2015-09-02 DIAGNOSIS — R0689 Other abnormalities of breathing: Secondary | ICD-10-CM

## 2015-09-02 DIAGNOSIS — B370 Candidal stomatitis: Secondary | ICD-10-CM

## 2015-09-02 LAB — RHEUMATOID FACTOR: Rhuematoid fact SerPl-aCnc: <10 IU/mL (ref <=14)

## 2015-09-02 LAB — LACTATE DEHYDROGENASE: LDH: 284 U/L — AB (ref 94–250)

## 2015-09-02 MED ORDER — NYSTATIN 100000 UNIT/ML MT SUSP: 5.0000 mL | Freq: Four times a day (QID) | OROMUCOSAL | 0 refills | Status: DC

## 2015-09-02 NOTE — Patient Instructions (Addendum)
Multiple lung nodules on CT - unclear cause  - check alfa feto protein, serum HCG, LDH, ANA, ANCA, GBM antibody, DS-DNA, RF, CCP,   - will call with results  - repeat ct chest wo contrast in 2 months  Oral thrush # Oral thrush - For Oral thrush: Take Suspension (swish and swallow): 500,000 units 4 times/day for 5 days; swish in the mouth and retain for as long as possible (several minutes) before swallowing   Dyspnea and respiratory abnormality - likely all due to ascites  - monitoir  FU After CT chest in 2 months

## 2015-09-02 NOTE — Progress Notes (Signed)
Subjective:    Patient ID: Michael [name], male    DOB: 1970/09/27, 45 y.o.   MRN: [id number] PCP Dorena Dew, FNP   HPI  IOV 09/02/2015  Chief Complaint  Patient presents with  . Pulmonary Consult    Pt referred by Dr. Regenia Skeeter for SOB. Pt c/o SOB with activity. Pt had paracentesis in ED and doesnt have SOB when fluid is removed. Pt has CT chest from ED visit.     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 He is here with his girlfriend/fiance [name]. They're here for evaluation of multiple nodules and dyspnea in the setting of new diagnosis of cirrhosis. Prior to May 2017 he was previously well and then started developing ascites. According to him and his fiance and review of the chart he was admitted for this and diagnosed with presumed alcoholic cirrhosis of the liver. MELD score was 28. He did not have endoscopy or liver biopsy at that time. He now follows up with Dr. Oletta Lamas. He was placed on prednisone at that time 40 mg per day. He Had 2 paracentesis large volume in May 2017. After this he's been on prednisone 40 mg per day. The last few weeks this is being tapered and patient feels since then his edema is getting worse. This resulted in acute emergency room visit 08/27/2015 and he had another large volume paracentesis. He was also having dyspnea. He had CT scan of the chest at this point in time that showed multiple lung nodules that was new. He does not have a previous medical history this is an etiology for this lung nodules. Therefore he is been referred here. His HIV test was normal 08/18/2015. Review of his blood work shows improving liver function tests as of 08/27/2015. He has mild chronic hyponatremia from his cirrhosis. He has normal renal function. His last bilirubin was 3.3 mg percent and this is significantly improved from 14 mg of May 2017. He does not have a history of autoimmune workup. At this point in time he denies any fever or chills  nausea vomiting, hemoptysis, sputum production. He is uninsured. They're looking into disability. He is interested in the liver transplant but previously sober for 6 months which is been sober only for 3 months.    has a past medical history of Anxiety; Ascites; and Cirrhosis of liver ([place]).   reports that he quit smoking about 2 months ago. His smoking use included Cigarettes. He has a 18.00 pack-year smoking history. He has never used smokeless tobacco.  Past Surgical History:  Procedure Laterality Date  . NO PAST SURGERIES     SURGERY AS INFANT PYLORIC STENOSIS  . OPEN REDUCTION INTERNAL FIXATION (ORIF) FOOT LISFRANC FRACTURE Right 08/06/2014   Procedure: RIGHT FOOT FRACTURE AND LISFRANC LIGAMENT INJURY FIXATION AND SUTURE REMOVAL LEFT WRIST INCISION;  Surgeon: Newt Minion, MD;  Location: [facility];  Service: Orthopedics;  Laterality: Right;  . ORIF WRIST FRACTURE Left 07/20/2014   Procedure: OPEN REDUCTION INTERNAL FIXATION (ORIF) WRIST FRACTURE;  Surgeon: Meredith Pel, MD;  Location: [facility];  Service: Orthopedics;  Laterality: Left;  . PARACENTESIS    . SMALL INTESTINE SURGERY     as an infant    No Known Allergies  Immunization History  Administered Date(s) Administered  . Td 07/13/2014    Family History  Problem Relation Age of Onset  . Prostate cancer Father   . Diabetes Mellitus II Paternal Grandfather  Current Outpatient Prescriptions:  .  busPIRone (BUSPAR) 5 MG tablet, Take 1 tablet (5 mg total) by mouth 2 (two) times daily., Disp: 60 tablet, Rfl: 1 .  folic acid (FOLVITE) 1 MG tablet, Take 1 tablet (1 mg total) by mouth daily., Disp: 30 tablet, Rfl: 3 .  furosemide (LASIX) 40 MG tablet, Take 1 tablet (40 mg total) by mouth daily. (Patient taking differently: Take 40 mg by mouth 2 (two) times daily. ), Disp: 30 tablet, Rfl: 3 .  lactulose (CHRONULAC) 10 GM/15ML solution, Take 15 mLs (10 g total) by mouth 2 (two) times daily., Disp: 240 mL, Rfl: 3 .  omeprazole  (PRILOSEC) 20 MG capsule, Take 20 mg by mouth 2 (two) times daily before a meal., Disp: , Rfl:  .  pantoprazole (PROTONIX) 40 MG tablet, Take 1 tablet (40 mg total) by mouth daily., Disp: 30 tablet, Rfl: 3 .  predniSONE (DELTASONE) 5 MG tablet, Take 5-40 mg by mouth daily with breakfast. TAPERING DOWN DOSAGE EACH WEEK., Disp: , Rfl:  .  spironolactone (ALDACTONE) 50 MG tablet, Take 1 tablet (50 mg total) by mouth 2 (two) times daily., Disp: 60 tablet, Rfl: 3 .  thiamine 100 MG tablet, Take 1 tablet (100 mg total) by mouth daily., Disp: 30 tablet, Rfl: 3   Review of Systems  Constitutional: Negative for fever and unexpected weight change.  HENT: Positive for congestion. Negative for dental problem, ear pain, nosebleeds, postnasal drip, rhinorrhea, sinus pressure, sneezing, sore throat and trouble swallowing.   Eyes: Negative for redness and itching.  Respiratory: Positive for shortness of breath. Negative for cough, chest tightness and wheezing.   Cardiovascular: Positive for leg swelling. Negative for palpitations.  Gastrointestinal: Negative for nausea and vomiting.  Genitourinary: Negative for dysuria.  Musculoskeletal: Negative for joint swelling.  Skin: Negative for rash.  Neurological: Negative for headaches.  Hematological: Does not bruise/bleed easily.  Psychiatric/Behavioral: Negative for dysphoric mood. The patient is nervous/anxious.        Objective:   Physical Exam  Constitutional: He is oriented to person, place, and time. He appears well-developed and well-nourished. No distress.  Scleral jaundice  HENT:  Head: Normocephalic and atraumatic.  Right Ear: External ear normal.  Left Ear: External ear normal.  Mouth/Throat: Oropharynx is clear and moist. No oropharyngeal exudate.  Beard + Oral thrush present  Eyes: Conjunctivae and EOM are normal. Pupils are equal, round, and reactive to light. Right eye exhibits no discharge. Left eye exhibits no discharge. No scleral  icterus.  Neck: Normal range of motion. Neck supple. No JVD present. No tracheal deviation present. No thyromegaly present.  Cardiovascular: Normal rate, regular rhythm and intact distal pulses.  Exam reveals no gallop and no friction rub.   No murmur heard. Pulmonary/Chest: Effort normal and breath sounds normal. No respiratory distress. He has no wheezes. He has no rales. He exhibits no tenderness.  Diffuse spider angioma  Abdominal: Soft. Bowel sounds are normal. He exhibits distension. He exhibits no mass. There is no tenderness. There is no rebound and no guarding.  Significant ascites present  Musculoskeletal: Normal range of motion. He exhibits edema. He exhibits no tenderness.  Lymphadenopathy:    He has no cervical adenopathy.  Neurological: He is alert and oriented to person, place, and time. He has normal reflexes. No cranial nerve deficit. Coordination normal.  Skin: Skin is warm and dry. No rash noted. He is not diaphoretic. No erythema. No pallor.  Scaly lesions on his knuckles  Psychiatric: He has  a normal mood and affect. His behavior is normal. Judgment and thought content normal.  Nursing note and vitals reviewed.   Vitals:   09/02/15 1058  BP: 120/64  Pulse: (!) 109  SpO2: 98%  Weight: 154 lb 9.6 oz (70.1 kg)  Height: 5' 8.5" (1.74 m)         Assessment & Plan:     ICD-9-CM ICD-10-CM   1. Multiple lung nodules on CT 793.19 R91.8   2. Oral thrush 112.0 B37.0   3. Dyspnea and respiratory abnormality 786.09 R06.00     R06.89     Multiple lung nodules on CT - unclear cause  - check alfa feto protein, serum HCG, LDH, ANA, ANCA, GBM antibody, DS-DNA, RF, CCP;  will call with results  - repeat ct chest wo contrast in 2 months  Oral thrush # Oral thrush - HIV negative end July 2017  - ? 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 They're in agreement with the plan.   Dr. Brand Males, M.D., [facility].C.P Pulmonary and Critical Care Medicine Staff Physician [place] [facility] Pager: [phone], If no answer or between  15:00h - 7:00h: call [phone]  09/02/2015 11:36 AM

## 2015-09-03 LAB — AFP TUMOR MARKER: AFP TUMOR MARKER: 2.4 ng/mL (ref <6.1)

## 2015-09-05 ENCOUNTER — Emergency Department (HOSPITAL_COMMUNITY)
Admission: EM | Admit: 2015-09-05 | Discharge: 2015-09-05 | Disposition: A | Discharge status: Home / Self Care | Payer: Medicaid Other | Attending: Emergency Medicine | Admitting: Emergency Medicine

## 2015-09-05 ENCOUNTER — Emergency Department (HOSPITAL_COMMUNITY): Payer: Medicaid Other

## 2015-09-05 ENCOUNTER — Encounter (HOSPITAL_COMMUNITY): Payer: Self-pay | Admitting: Emergency Medicine

## 2015-09-05 DIAGNOSIS — K7031 Alcoholic cirrhosis of liver with ascites: Secondary | ICD-10-CM | POA: Diagnosis not present

## 2015-09-05 DIAGNOSIS — Z79899 Other long term (current) drug therapy: Secondary | ICD-10-CM | POA: Insufficient documentation

## 2015-09-05 DIAGNOSIS — Z87891 Personal history of nicotine dependence: Secondary | ICD-10-CM | POA: Insufficient documentation

## 2015-09-05 DIAGNOSIS — R109 Unspecified abdominal pain: Secondary | ICD-10-CM | POA: Diagnosis present

## 2015-09-05 DIAGNOSIS — R0602 Shortness of breath: Secondary | ICD-10-CM | POA: Insufficient documentation

## 2015-09-05 LAB — COMPREHENSIVE METABOLIC PANEL
ALK PHOS: 235 U/L — AB (ref 38–126)
ALT: 44 U/L (ref 17–63)
ANION GAP: 10 (ref 5–15)
AST: 64 U/L — ABNORMAL HIGH (ref 15–41)
Albumin: 2.1 g/dL — ABNORMAL LOW (ref 3.5–5.0)
BILIRUBIN TOTAL: 2.9 mg/dL — AB (ref 0.3–1.2)
BUN: 14 mg/dL (ref 6–20)
CALCIUM: 8.4 mg/dL — AB (ref 8.9–10.3)
CO2: 23 mmol/L (ref 22–32)
CREATININE: 0.98 mg/dL (ref 0.61–1.24)
Chloride: 94 mmol/L — ABNORMAL LOW (ref 101–111)
Glucose, Bld: 100 mg/dL — ABNORMAL HIGH (ref 65–99)
Potassium: 3.9 mmol/L (ref 3.5–5.1)
SODIUM: 127 mmol/L — AB (ref 135–145)
TOTAL PROTEIN: 6.3 g/dL — AB (ref 6.5–8.1)

## 2015-09-05 LAB — PROTIME-INR
INR: 1.57
Prothrombin Time: 18.9 seconds — ABNORMAL HIGH (ref 11.4–15.2)

## 2015-09-05 LAB — GRAM STAIN: Special Requests: NORMAL

## 2015-09-05 LAB — BODY FLUID CELL COUNT WITH DIFFERENTIAL
EOS FL: 0 %
LYMPHS FL: 12 %
MONOCYTE-MACROPHAGE-SEROUS FLUID: 25 % — AB (ref 50–90)
Neutrophil Count, Fluid: 63 % — ABNORMAL HIGH (ref 0–25)
Other Cells, Fluid: 0 %
WBC FLUID: 91 uL (ref 0–1000)

## 2015-09-05 LAB — URINALYSIS, ROUTINE W REFLEX MICROSCOPIC
Glucose, UA: NEGATIVE mg/dL
HGB URINE DIPSTICK: NEGATIVE
Ketones, ur: NEGATIVE mg/dL
Leukocytes, UA: NEGATIVE
NITRITE: NEGATIVE
Protein, ur: NEGATIVE mg/dL
SPECIFIC GRAVITY, URINE: 1.018 (ref 1.005–1.030)
pH: 6 (ref 5.0–8.0)

## 2015-09-05 LAB — CBC
HCT: 39.6 % (ref 39.0–52.0)
HEMOGLOBIN: 13.7 g/dL (ref 13.0–17.0)
MCH: 34.5 pg — ABNORMAL HIGH (ref 26.0–34.0)
MCHC: 34.6 g/dL (ref 30.0–36.0)
MCV: 99.7 fL (ref 78.0–100.0)
PLATELETS: 310 10*3/uL (ref 150–400)
RBC: 3.97 MIL/uL — AB (ref 4.22–5.81)
RDW: 13.4 % (ref 11.5–15.5)
WBC: 23.4 10*3/uL — AB (ref 4.0–10.5)

## 2015-09-05 LAB — ANTI-DNA ANTIBODY, DOUBLE-STRANDED

## 2015-09-05 LAB — GLUCOSE, SEROUS FLUID: Glucose, Fluid: 97 mg/dL

## 2015-09-05 LAB — CBG MONITORING, ED: Glucose-Capillary: 124 mg/dL — ABNORMAL HIGH (ref 65–99)

## 2015-09-05 LAB — CYCLIC CITRUL PEPTIDE ANTIBODY, IGG: Cyclic Citrullin Peptide Ab: <16 Units

## 2015-09-05 LAB — ANA: Anti Nuclear Antibody(ANA): NEGATIVE

## 2015-09-05 LAB — GLOMERULAR BASEMENT MEMBRANE ANTIBODIES: GBM Ab Interp: <1

## 2015-09-05 LAB — LIPASE, BLOOD: Lipase: 34 U/L (ref 11–51)

## 2015-09-05 MED ORDER — LIDOCAINE HCL 1 % IJ SOLN
INTRAMUSCULAR | Status: AC
  Filled 2015-09-05: qty 20

## 2015-09-05 NOTE — Procedures (Signed)
Successful US guided paracentesis from RLQ.  Yielded 6.3 liters of clear yellow fluid.  No immediate complications.  Pt tolerated well.   Raedyn Klinck S Bary Limbach PA-C 09/05/2015 2:58 PM

## 2015-09-05 NOTE — ED Notes (Signed)
Provider at bedside

## 2015-09-05 NOTE — ED Triage Notes (Signed)
Pt c/o swelling to abdomen since Last Saturday. Pt currently being weaned off prednisone.

## 2015-09-05 NOTE — ED Provider Notes (Signed)
[facility]-EMERGENCY DEPT Provider Note   CSN: [id number] Arrival date & time: 09/05/15  1006  First Provider Contact:  First MD Initiated Contact with Patient 09/05/15 1100        History   Chief Complaint Chief Complaint  Patient presents with  . Abdominal Pain    HPI Michael [name] is a 45 y.o. male with history of alcoholic cirrhosis who presents with increasing abdominal distention and dyspnea on exertion. Patient had a 4L paracentesis 9 days ago. Patient states he felt "like 1 million bucks" for a few days after, however his symptoms are returning. Patient is also being weaned off prednisone that was begun May 24. Patient is wondering if he may have symptoms related to that. Patient reports that he had edema in bilateral testicles Wednesday which is now resolved, as well as left ankle edema, which is beginning to resolve. Patient has also had a decreased appetite, which she associates with his ascites. Prior to patient's paracentesis 9 days ago, patient had a chest CT which showed possible metastatic lesions. Patient was referred to outpatient pulmonology. Pulmonologist told him that it was most likely benign and made a long-term follow-up for him. Patient was seen last Wednesday and the pulmonologist did not order a chest x-ray. Patient declined a chest x-ray here if it is unnecessary. Patient states that he has had a temperature of 101 every time he changes his prednisone taper, which seems to go away after he takes his daily medications. Patient denies any chest pain, shortness of breath at rest, abdominal pain, nausea, vomiting, dysuria.  HPI  Past Medical History:  Diagnosis Date  . Anxiety   . Ascites   . Cirrhosis of liver [facility])     Patient Active Problem List   Diagnosis Date Noted  . Multiple lung nodules on CT 09/02/2015  . Oral thrush 09/02/2015  . Dyspnea and respiratory abnormality 09/02/2015  . Liver failure without hepatic coma (HCC)   . Liver failure (HCC) 06/17/2015    . Alcoholic cirrhosis of liver with ascites (HCC) 06/17/2015  . Acute alcoholic hepatitis 06/17/2015  . Coagulopathy (HCC) 06/17/2015  . Macrocytic anemia 06/17/2015  . ARF (acute renal failure) (HCC) 06/17/2015  . Ascites 06/17/2015    Past Surgical History:  Procedure Laterality Date  . NO PAST SURGERIES     SURGERY AS INFANT PYLORIC STENOSIS  . OPEN REDUCTION INTERNAL FIXATION (ORIF) FOOT LISFRANC FRACTURE Right 08/06/2014   Procedure: RIGHT FOOT FRACTURE AND LISFRANC LIGAMENT INJURY FIXATION AND SUTURE REMOVAL LEFT WRIST INCISION;  Surgeon: Nadara Mustard, MD;  Location[facility];  Service: Orthopedics;  Laterality: Right;  . ORIF WRIST FRACTURE Left 07/20/2014   Procedure: OPEN REDUCTION INTERNAL FIXATION (ORIF) WRIST FRACTURE;  Surgeon: Cammy Copa, MD;  Location[facility] OR;  Service: Orthopedics;  Laterality: Left;  . PARACENTESIS    . SMALL INTESTINE SURGERY     as an infant       Home Medications    Prior to Admission medications   Medication Sig Start Date End Date Taking? Authorizing Provider  busPIRone (BUSPAR) 5 MG tablet Take 1 tablet (5 mg total) by mouth 2 (two) times daily. 08/18/15  Yes Henrietta Hoover, NP  folic acid (FOLVITE) 1 MG tablet Take 1 tablet (1 mg total) by mouth daily. 07/12/15  Yes Massie Maroon, FNP  furosemide (LASIX) 40 MG tablet Take 1 tablet (40 mg total) by mouth daily. Patient taking differently: Take 40 mg by mouth 2 (two) times daily.  07/12/15  Yes Massie MaroonLachina M Hollis, FNP  lactulose (CHRONULAC) 10 GM/15ML solution Take 15 mLs (10 g total) by mouth 2 (two) times daily. 08/05/15  Yes Massie MaroonLachina M Hollis, FNP  omeprazole (PRILOSEC) 20 MG capsule Take 20 mg by mouth 2 (two) times daily before a meal.   Yes Historical Provider, MD  pantoprazole (PROTONIX) 40 MG tablet Take 1 tablet (40 mg total) by mouth daily. 07/12/15  Yes Massie MaroonLachina M Hollis, FNP  predniSONE (DELTASONE) 5 MG tablet Take 20 mg by mouth daily with breakfast. TAPERING DOWN DOSAGE EACH  WEEK.    Yes Historical Provider, MD  spironolactone (ALDACTONE) 50 MG tablet Take 1 tablet (50 mg total) by mouth 2 (two) times daily. 07/12/15  Yes Massie MaroonLachina M Hollis, FNP  Tetrahydrozoline HCl (VISINE OP) Place 2 drops into both eyes as needed (for dry eyes).   Yes Historical Provider, MD  thiamine 100 MG tablet Take 1 tablet (100 mg total) by mouth daily. 07/12/15  Yes Massie MaroonLachina M Hollis, FNP  nystatin (MYCOSTATIN) 100000 UNIT/ML suspension Take 5 mLs (500,000 Units total) by mouth 4 (four) times daily. 09/02/15   Kalman ShanMurali Ramaswamy, MD    Family History Family History  Problem Relation Age of Onset  . Prostate cancer Father   . Diabetes Mellitus II Paternal Grandfather     Social History Social History  Substance Use Topics  . Smoking status: Former Smoker    Packs/day: 1.00    Years: 18.00    Types: Cigarettes    Quit date: 06/11/2015  . Smokeless tobacco: Never Used  . Alcohol use No     Comment: stoped in May 2017      Allergies   Review of patient's allergies indicates no known allergies.   Review of Systems Review of Systems  Constitutional: Negative for chills and fever.  HENT: Negative for facial swelling.   Respiratory: Positive for shortness of breath (on exertion).   Cardiovascular: Negative for chest pain.  Gastrointestinal: Positive for abdominal distention. Negative for abdominal pain, nausea and vomiting.  Genitourinary: Negative for dysuria.  Musculoskeletal: Negative for back pain.  Skin: Negative for rash and wound.  Neurological: Negative for headaches.  Psychiatric/Behavioral: The patient is not nervous/anxious.      Physical Exam Updated Vital Signs BP 114/72 (BP Location: Right Arm)   Pulse 107   Temp 98.4 F (36.9 C) (Oral)   Resp 20   Ht 5\' 9"  (1.753 m)   Wt 68.1 kg   SpO2 100%   BMI 22.18 kg/m   Physical Exam  Constitutional: He appears well-developed and well-nourished. No distress.  HENT:  Head: Normocephalic and atraumatic.   Mouth/Throat: Oropharynx is clear and moist. No oropharyngeal exudate.  Eyes: Conjunctivae are normal. Pupils are equal, round, and reactive to light. Right eye exhibits no discharge. Left eye exhibits no discharge. No scleral icterus.  Jaundice conjunctiva  Neck: Normal range of motion. Neck supple. No thyromegaly present.  Cardiovascular: Normal rate, regular rhythm, normal heart sounds and intact distal pulses.  Exam reveals no gallop and no friction rub.   No murmur heard. Pulmonary/Chest: Effort normal and breath sounds normal. No stridor. No respiratory distress. He has no wheezes. He has no rales.  Abdominal: Soft. Bowel sounds are normal. He exhibits distension (moderate-severe) and ascites. There is no tenderness. There is no rigidity, no rebound, no guarding, no tenderness at McBurney's point and negative Murphy's sign.  Musculoskeletal: He exhibits no edema.  Lymphadenopathy:    He has no cervical adenopathy.  Neurological: He is alert. Coordination normal.  Skin: Skin is warm and dry. No rash noted. He is not diaphoretic. No pallor.  Psychiatric: He has a normal mood and affect.  Nursing note and vitals reviewed.    ED Treatments / Results  Labs (all labs ordered are listed, but only abnormal results are displayed) Labs Reviewed  COMPREHENSIVE METABOLIC PANEL - Abnormal; Notable for the following:       Result Value   Sodium 127 (*)    Chloride 94 (*)    Glucose, Bld 100 (*)    Calcium 8.4 (*)    Total Protein 6.3 (*)    Albumin 2.1 (*)    AST 64 (*)    Alkaline Phosphatase 235 (*)    Total Bilirubin 2.9 (*)    All other components within normal limits  CBC - Abnormal; Notable for the following:    WBC 23.4 (*)    RBC 3.97 (*)    MCH 34.5 (*)    All other components within normal limits  URINALYSIS, ROUTINE W REFLEX MICROSCOPIC (NOT AT [facility]) - Abnormal; Notable for the following:    Color, Urine AMBER (*)    Bilirubin Urine SMALL (*)    All other components  within normal limits  PROTIME-INR - Abnormal; Notable for the following:    Prothrombin Time 18.9 (*)    All other components within normal limits  BODY FLUID CELL COUNT WITH DIFFERENTIAL - Abnormal; Notable for the following:    Neutrophil Count, Fluid 63 (*)    Monocyte-Macrophage-Serous Fluid 25 (*)    All other components within normal limits  CBG MONITORING, ED - Abnormal; Notable for the following:    Glucose-Capillary 124 (*)    All other components within normal limits  GRAM STAIN  CULTURE, BODY FLUID-BOTTLE  LIPASE, BLOOD  MISC [facility] TEST (SEND OUT)  GLUCOSE, SEROUS FLUID    EKG  EKG Interpretation None       Radiology US Paracentesis  Result Date: 09/05/2015 INDICATION: Alcoholic cirrhosis, recurrent ascites. Request for therapeutic paracentesis. EXAM: ULTRASOUND GUIDED RIGHT LOWER QUADRANT PARACENTESIS MEDICATIONS: 1% Lidocaine. COMPLICATIONS: None immediate. PROCEDURE: Informed written consent was obtained from the patient after a discussion of the risks, benefits and alternatives to treatment. A timeout was performed prior to the initiation of the procedure. Initial ultrasound scanning demonstrates a large amount of ascites within the right lower abdominal quadrant. The right lower abdomen was prepped and draped in the usual sterile fashion. 1% lidocaine with epinephrine was used for local anesthesia. Following this, a 19 gauge, 7-cm, Yueh catheter was introduced. An ultrasound image was saved for documentation purposes. The paracentesis was performed. The catheter was removed and a dressing was applied. The patient tolerated the procedure well without immediate post procedural complication. FINDINGS: A total of approximately 6.3 liters of clear yellow fluid was removed. IMPRESSION: Successful ultrasound-guided paracentesis yielding 6.3 liters of peritoneal fluid. Read by:  Corrin Parker, PA-C Electronically Signed   By: Corlis Leak M.D.   On: 09/05/2015 14:57     Procedures Procedures (including critical care time)  Medications Ordered in ED Medications  lidocaine (XYLOCAINE) 1 % (with pres) injection (not administered)     Initial Impression / Assessment and Plan / ED Course  I have reviewed the triage vital signs and the nursing notes.  Pertinent labs & imaging results that were available during my care of the patient were reviewed by me and considered in my medical decision making (see chart for details).  Clinical Course    1:00 PM I spoke with interventional radiology who requested a ultrasound for paracentesis. Following ultrasound, patient to be evaluated for paracentesis with hopeful procedure today.  2:30 PM Patient returned from interventional radiology following paracentesis, 6.3 L total fluid removed. Patient reports he feels back to baseline and that he able to breathe much better now. Patient laying comfortably in room eating a snack.  Final Clinical Impressions(s) / ED Diagnoses   Final diagnoses:  Ascites due to alcoholic cirrhosis (HCC)   Patient with acute on chronic ascites due to alcoholic cirrhosis. Paracentesis completed with 6.3 L of peritoneal fluid removed. Patient states he is feeling back to baseline and is no longer feeling short of breath. Lung sounds are clear. Patient declines chest x-ray due to the many scans on his chest he has had in the past week and his pulmonologist stated that he did not want to do another when the patient saw him on Wednesday. CBC shows WBC 23.4. Patient on chronic prednisone use and peritoneal fluid showed no organism, a few WBC predominantly PMNs. Pro time 18.9. CMP shows stable sodium 127, chloride 94, calcium 8.4, protein 6.3, albumin 2.1, AST 64, alkaline phosphatase 235, total bilirubin 2.9. Patient to follow-up with Dr. Randa Evens at [facility] gastroenterology at his scheduled appointment on Wednesday. Patient understands and agrees with plan. Strict return precautions discussed. Patient  discussed with Dr. Fayrene Fearing who guided the patient's management and agrees with plan. Patient vitals stable (patient with chronic tachycardia per patient and medical records) and discharged in satisfactory condition.  New Prescriptions Discharge Medication List as of 09/05/2015  4:19 PM       Emi Holes, PA-C 09/05/15 1758    Rolland Porter, MD 09/13/15 [phone]

## 2015-09-05 NOTE — Discharge Instructions (Signed)
Please follow-up with Dr. Randa EvensEdwards at your scheduled appointment this week. Please return to the emergency department if you develop any new or worsening symptoms.

## 2015-09-05 NOTE — ED Notes (Signed)
Called US when patient will be having US stated sent for patient.

## 2015-09-06 LAB — PATHOLOGIST SMEAR REVIEW: Path Review: REACTIVE

## 2015-09-06 LAB — ANCA SCREEN W REFLEX TITER: ANCA Screen: NEGATIVE

## 2015-09-07 LAB — HCG, SERUM, QUALITATIVE

## 2015-09-07 MED FILL — SPIRONOLACTONE 100 MG TAB: 100 | 30 days supply | Qty: 60 | Fill #0

## 2015-09-07 MED FILL — FUROSEMIDE 80 MG TABLET: 80 | 30 days supply | Qty: 60 | Fill #0 | Status: TO

## 2015-09-07 MED FILL — ?CIPROFLOXACIN HCL 500MG TA: 500 | 14 days supply | Qty: 28 | Fill #0

## 2015-09-08 ENCOUNTER — Other Ambulatory Visit: Payer: Self-pay | Admitting: Family Medicine

## 2015-09-08 DIAGNOSIS — K7010 Alcoholic hepatitis without ascites: Secondary | ICD-10-CM

## 2015-09-09 ENCOUNTER — Telehealth: Payer: Self-pay | Admitting: Internal Medicine

## 2015-09-09 NOTE — Telephone Encounter (Signed)
Called and spoke with [name], pts fiance and she stated that they were calling to get the results of the labs that was done last week with MR.  MR please advise. thanks

## 2015-09-09 NOTE — Telephone Encounter (Signed)
Spoke with pt's fiance [name] (dpr on file), aware of recs.  Nothing further needed.

## 2015-09-09 NOTE — Telephone Encounter (Signed)
pls tell Michael [name] or his fiance only reason not called so far is that it took this long for all results to come back. We were looking for unsiual causses of his lung nodules - like vasculitis - all tests normal. I did order beta hcg which is elevated in some germ cell tumors but lab canceled it prsumably wondring why I was ordring pregnancy test in a man. Rest all negative. We can watch nodule with repeat HRCT in 2 months as per OV 09/02/15  Dr. Kalman ShanMurali [name], M.D., [facility].C.P Pulmonary and Critical Care Medicine Staff Physician [facility] [place] [facility] Pager: [phone], If no answer or between  15:00h - 7:00h: call [phone]  09/09/2015 11:12 AM

## 2015-09-10 LAB — CULTURE, BODY FLUID-BOTTLE: CULTURE: NO GROWTH

## 2015-09-10 LAB — CULTURE, BODY FLUID W GRAM STAIN -BOTTLE

## 2015-09-19 MED FILL — ?PANTOPRAZOLE SOD DR 40MG: 40 MG | 30 days supply | Qty: 30 | Fill #2

## 2015-09-27 ENCOUNTER — Other Ambulatory Visit (HOSPITAL_COMMUNITY): Payer: Self-pay | Admitting: Gastroenterology

## 2015-09-27 DIAGNOSIS — K7011 Alcoholic hepatitis with ascites: Secondary | ICD-10-CM

## 2015-10-04 ENCOUNTER — Ambulatory Visit (HOSPITAL_COMMUNITY)
Admission: RE | Admit: 2015-10-04 | Discharge: 2015-10-04 | Disposition: A | Discharge status: Home / Self Care | Payer: Medicaid Other | Source: Ambulatory Visit | Attending: Gastroenterology | Admitting: Gastroenterology

## 2015-10-04 DIAGNOSIS — R188 Other ascites: Secondary | ICD-10-CM | POA: Insufficient documentation

## 2015-10-04 DIAGNOSIS — K7011 Alcoholic hepatitis with ascites: Secondary | ICD-10-CM | POA: Diagnosis present

## 2015-10-04 LAB — BODY FLUID CELL COUNT WITH DIFFERENTIAL
Eos, Fluid: 0 %
LYMPHS FL: 16 %
Monocyte-Macrophage-Serous Fluid: 57 % (ref 50–90)
Neutrophil Count, Fluid: 27 % — ABNORMAL HIGH (ref 0–25)
Total Nucleated Cell Count, Fluid: 70 cu mm (ref 0–1000)

## 2015-10-04 LAB — PATHOLOGIST SMEAR REVIEW

## 2015-10-04 NOTE — Procedures (Signed)
Ultrasound-guided diagnostic and therapeutic paracentesis performed yielding 3 liters of clear yellow colored fluid. No immediate complications.  Michael [name] E 12:24 PM 10/04/2015

## 2015-10-11 MED FILL — SPIRONOLACTONE 100 MG TAB: 100 | 30 days supply | Qty: 60 | Fill #1

## 2015-10-18 ENCOUNTER — Observation Stay (HOSPITAL_COMMUNITY): Payer: Medicaid Other

## 2015-10-18 ENCOUNTER — Encounter (HOSPITAL_COMMUNITY): Payer: Self-pay

## 2015-10-18 ENCOUNTER — Inpatient Hospital Stay (HOSPITAL_COMMUNITY)
Admission: EM | Admit: 2015-10-18 | Discharge: 2015-10-22 | DRG: 682 | Disposition: A | Discharge status: Other Acute Inpatient Hospital | Payer: Medicaid Other | Attending: Family Medicine | Admitting: Family Medicine

## 2015-10-18 DIAGNOSIS — K767 Hepatorenal syndrome: Secondary | ICD-10-CM

## 2015-10-18 DIAGNOSIS — E871 Hypo-osmolality and hyponatremia: Secondary | ICD-10-CM | POA: Diagnosis present

## 2015-10-18 DIAGNOSIS — D72829 Elevated white blood cell count, unspecified: Secondary | ICD-10-CM | POA: Diagnosis present

## 2015-10-18 DIAGNOSIS — T501X5A Adverse effect of loop [high-ceiling] diuretics, initial encounter: Secondary | ICD-10-CM | POA: Diagnosis present

## 2015-10-18 DIAGNOSIS — Z79899 Other long term (current) drug therapy: Secondary | ICD-10-CM

## 2015-10-18 DIAGNOSIS — B370 Candidal stomatitis: Secondary | ICD-10-CM | POA: Diagnosis present

## 2015-10-18 DIAGNOSIS — K7031 Alcoholic cirrhosis of liver with ascites: Secondary | ICD-10-CM | POA: Diagnosis present

## 2015-10-18 DIAGNOSIS — N179 Acute kidney failure, unspecified: Secondary | ICD-10-CM | POA: Diagnosis not present

## 2015-10-18 DIAGNOSIS — G8929 Other chronic pain: Secondary | ICD-10-CM | POA: Diagnosis present

## 2015-10-18 DIAGNOSIS — Z7952 Long term (current) use of systemic steroids: Secondary | ICD-10-CM

## 2015-10-18 DIAGNOSIS — Z833 Family history of diabetes mellitus: Secondary | ICD-10-CM

## 2015-10-18 DIAGNOSIS — R188 Other ascites: Secondary | ICD-10-CM

## 2015-10-18 DIAGNOSIS — E860 Dehydration: Secondary | ICD-10-CM | POA: Diagnosis present

## 2015-10-18 DIAGNOSIS — R000 Tachycardia, unspecified: Secondary | ICD-10-CM | POA: Diagnosis not present

## 2015-10-18 DIAGNOSIS — K652 Spontaneous bacterial peritonitis: Secondary | ICD-10-CM | POA: Diagnosis present

## 2015-10-18 DIAGNOSIS — E872 Acidosis: Secondary | ICD-10-CM | POA: Diagnosis present

## 2015-10-18 DIAGNOSIS — I959 Hypotension, unspecified: Secondary | ICD-10-CM | POA: Diagnosis not present

## 2015-10-18 DIAGNOSIS — F1010 Alcohol abuse, uncomplicated: Secondary | ICD-10-CM | POA: Diagnosis present

## 2015-10-18 DIAGNOSIS — M6281 Muscle weakness (generalized): Secondary | ICD-10-CM

## 2015-10-18 DIAGNOSIS — R278 Other lack of coordination: Secondary | ICD-10-CM | POA: Diagnosis not present

## 2015-10-18 DIAGNOSIS — R918 Other nonspecific abnormal finding of lung field: Secondary | ICD-10-CM | POA: Diagnosis present

## 2015-10-18 DIAGNOSIS — K769 Liver disease, unspecified: Secondary | ICD-10-CM

## 2015-10-18 DIAGNOSIS — E869 Volume depletion, unspecified: Secondary | ICD-10-CM | POA: Diagnosis present

## 2015-10-18 DIAGNOSIS — R251 Tremor, unspecified: Secondary | ICD-10-CM | POA: Diagnosis not present

## 2015-10-18 DIAGNOSIS — F419 Anxiety disorder, unspecified: Secondary | ICD-10-CM | POA: Diagnosis present

## 2015-10-18 DIAGNOSIS — L02413 Cutaneous abscess of right upper limb: Secondary | ICD-10-CM | POA: Diagnosis present

## 2015-10-18 DIAGNOSIS — N189 Chronic kidney disease, unspecified: Secondary | ICD-10-CM | POA: Diagnosis present

## 2015-10-18 DIAGNOSIS — Z8042 Family history of malignant neoplasm of prostate: Secondary | ICD-10-CM

## 2015-10-18 DIAGNOSIS — Z22322 Carrier or suspected carrier of Methicillin resistant Staphylococcus aureus: Secondary | ICD-10-CM

## 2015-10-18 DIAGNOSIS — Z87891 Personal history of nicotine dependence: Secondary | ICD-10-CM

## 2015-10-18 DIAGNOSIS — K7290 Hepatic failure, unspecified without coma: Secondary | ICD-10-CM | POA: Diagnosis present

## 2015-10-18 DIAGNOSIS — K7460 Unspecified cirrhosis of liver: Secondary | ICD-10-CM

## 2015-10-18 LAB — COMPREHENSIVE METABOLIC PANEL
ALT: 39 U/L (ref 17–63)
ANION GAP: 13 (ref 5–15)
AST: 51 U/L — ABNORMAL HIGH (ref 15–41)
Albumin: 2.1 g/dL — ABNORMAL LOW (ref 3.5–5.0)
Alkaline Phosphatase: 329 U/L — ABNORMAL HIGH (ref 38–126)
BUN: 59 mg/dL — AB (ref 6–20)
CALCIUM: 8.8 mg/dL — AB (ref 8.9–10.3)
CO2: 19 mmol/L — AB (ref 22–32)
Chloride: 92 mmol/L — ABNORMAL LOW (ref 101–111)
Creatinine, Ser: 3.39 mg/dL — ABNORMAL HIGH (ref 0.61–1.24)
GFR calc Af Amer: 23 mL/min — ABNORMAL LOW (ref >60)
GFR, EST NON AFRICAN AMERICAN: 20 mL/min — AB (ref >60)
GLUCOSE: 90 mg/dL (ref 65–99)
Potassium: 5.3 mmol/L — ABNORMAL HIGH (ref 3.5–5.1)
SODIUM: 124 mmol/L — AB (ref 135–145)
Total Bilirubin: 3.6 mg/dL — ABNORMAL HIGH (ref 0.3–1.2)
Total Protein: 5.8 g/dL — ABNORMAL LOW (ref 6.5–8.1)

## 2015-10-18 LAB — BASIC METABOLIC PANEL
Anion gap: 12 (ref 5–15)
BUN: 55 mg/dL — AB (ref 6–20)
CALCIUM: 8.4 mg/dL — AB (ref 8.9–10.3)
CHLORIDE: 96 mmol/L — AB (ref 101–111)
CO2: 17 mmol/L — AB (ref 22–32)
CREATININE: 2.78 mg/dL — AB (ref 0.61–1.24)
GFR calc Af Amer: 30 mL/min — ABNORMAL LOW (ref >60)
GFR calc non Af Amer: 26 mL/min — ABNORMAL LOW (ref >60)
Glucose, Bld: 101 mg/dL — ABNORMAL HIGH (ref 65–99)
Potassium: 4.7 mmol/L (ref 3.5–5.1)
SODIUM: 125 mmol/L — AB (ref 135–145)

## 2015-10-18 LAB — LIPASE, BLOOD: Lipase: 113 U/L — ABNORMAL HIGH (ref 11–51)

## 2015-10-18 LAB — URINALYSIS, ROUTINE W REFLEX MICROSCOPIC
BILIRUBIN URINE: NEGATIVE
Glucose, UA: NEGATIVE mg/dL
Hgb urine dipstick: NEGATIVE
Ketones, ur: NEGATIVE mg/dL
LEUKOCYTES UA: NEGATIVE
NITRITE: NEGATIVE
Protein, ur: NEGATIVE mg/dL
SPECIFIC GRAVITY, URINE: 1.013 (ref 1.005–1.030)
pH: 5 (ref 5.0–8.0)

## 2015-10-18 LAB — PROTIME-INR
INR: 1.58
PROTHROMBIN TIME: 19 s — AB (ref 11.4–15.2)

## 2015-10-18 LAB — CBC
HCT: 41.3 % (ref 39.0–52.0)
Hemoglobin: 14.7 g/dL (ref 13.0–17.0)
MCH: 34 pg (ref 26.0–34.0)
MCHC: 35.6 g/dL (ref 30.0–36.0)
MCV: 95.6 fL (ref 78.0–100.0)
PLATELETS: 260 10*3/uL (ref 150–400)
RBC: 4.32 MIL/uL (ref 4.22–5.81)
RDW: 13.9 % (ref 11.5–15.5)
WBC: 23 10*3/uL — ABNORMAL HIGH (ref 4.0–10.5)

## 2015-10-18 MED ORDER — PANTOPRAZOLE SODIUM 40 MG PO TBEC
40.0000 mg | DELAYED_RELEASE_TABLET | Freq: Every day | ORAL | Status: DC
  Administered 2015-10-18 – 2015-10-22 (×5): 40 mg via ORAL
  Filled 2015-10-18 (×5): qty 1

## 2015-10-18 MED ORDER — ENOXAPARIN SODIUM 30 MG/0.3ML ~~LOC~~ SOLN
30.0000 mg | SUBCUTANEOUS | Status: DC
  Administered 2015-10-18 – 2015-10-19 (×2): 30 mg via SUBCUTANEOUS
  Filled 2015-10-18 (×2): qty 0.3

## 2015-10-18 MED ORDER — LORAZEPAM 2 MG/ML IJ SOLN
0.0000 mg | Freq: Four times a day (QID) | INTRAMUSCULAR | Status: DC
Start: 2015-10-18 — End: 2015-10-19
  Administered 2015-10-18: 2 mg via INTRAVENOUS
  Filled 2015-10-18: qty 1

## 2015-10-18 MED ORDER — FUROSEMIDE 10 MG/ML IJ SOLN
60.0000 mg | Freq: Once | INTRAMUSCULAR | Status: AC
  Administered 2015-10-18: 60 mg via INTRAVENOUS
  Filled 2015-10-18: qty 6

## 2015-10-18 MED ORDER — ADULT MULTIVITAMIN W/MINERALS CH
1.0000 | ORAL_TABLET | Freq: Every day | ORAL | Status: DC
  Administered 2015-10-18 – 2015-10-22 (×5): 1 via ORAL
  Filled 2015-10-18 (×5): qty 1

## 2015-10-18 MED ORDER — PREDNISONE 10 MG PO TABS
10.0000 mg | ORAL_TABLET | Freq: Every day | ORAL | Status: DC
  Administered 2015-10-19 – 2015-10-22 (×4): 10 mg via ORAL
  Filled 2015-10-18 (×4): qty 1

## 2015-10-18 MED ORDER — BUSPIRONE HCL 5 MG PO TABS
5.0000 mg | ORAL_TABLET | Freq: Two times a day (BID) | ORAL | Status: DC
  Administered 2015-10-18 – 2015-10-19 (×2): 5 mg via ORAL
  Filled 2015-10-18: qty 1
  Filled 2015-10-18 (×2): qty 0.5
  Filled 2015-10-18: qty 1

## 2015-10-18 MED ORDER — THIAMINE HCL 100 MG/ML IJ SOLN: 100.0000 mg | Freq: Every day | INTRAMUSCULAR | Status: DC

## 2015-10-18 MED ORDER — ONDANSETRON HCL 4 MG PO TABS: 4.0000 mg | ORAL_TABLET | Freq: Four times a day (QID) | ORAL | Status: DC | PRN

## 2015-10-18 MED ORDER — ONDANSETRON HCL 4 MG/2ML IJ SOLN: 4.0000 mg | Freq: Four times a day (QID) | INTRAMUSCULAR | Status: DC | PRN

## 2015-10-18 MED ORDER — LORAZEPAM 2 MG/ML IJ SOLN: 0.0000 mg | Freq: Two times a day (BID) | INTRAMUSCULAR | Status: DC

## 2015-10-18 MED ORDER — SODIUM CHLORIDE 0.9 % IV SOLN
INTRAVENOUS | Status: DC
  Administered 2015-10-18 – 2015-10-20 (×4): via INTRAVENOUS

## 2015-10-18 MED ORDER — THIAMINE HCL 100 MG PO TABS: 100.0000 mg | ORAL_TABLET | Freq: Every day | ORAL | Status: DC

## 2015-10-18 MED ORDER — LACTULOSE 10 GM/15ML PO SOLN
20.0000 g | Freq: Two times a day (BID) | ORAL | Status: DC
  Administered 2015-10-18 – 2015-10-20 (×5): 20 g via ORAL
  Filled 2015-10-18 (×5): qty 30

## 2015-10-18 MED ORDER — LORAZEPAM 1 MG PO TABS: 1.0000 mg | ORAL_TABLET | Freq: Four times a day (QID) | ORAL | Status: DC | PRN

## 2015-10-18 MED ORDER — FOLIC ACID 1 MG PO TABS
1.0000 mg | ORAL_TABLET | Freq: Every day | ORAL | Status: DC
  Administered 2015-10-18 – 2015-10-22 (×5): 1 mg via ORAL
  Filled 2015-10-18 (×5): qty 1

## 2015-10-18 MED ORDER — ADULT MULTIVITAMIN W/MINERALS CH: 1.0000 | ORAL_TABLET | Freq: Every day | ORAL | Status: DC

## 2015-10-18 MED ORDER — FOLIC ACID 1 MG PO TABS: 1.0000 mg | ORAL_TABLET | Freq: Every day | ORAL | Status: DC

## 2015-10-18 MED ORDER — NYSTATIN 100000 UNIT/ML MT SUSP
5.0000 mL | Freq: Four times a day (QID) | OROMUCOSAL | Status: DC
  Administered 2015-10-18 – 2015-10-21 (×11): 500000 [IU] via ORAL
  Filled 2015-10-18 (×8): qty 5

## 2015-10-18 MED ORDER — LORAZEPAM 2 MG/ML IJ SOLN: 1.0000 mg | Freq: Four times a day (QID) | INTRAMUSCULAR | Status: DC | PRN

## 2015-10-18 MED ORDER — SPIRONOLACTONE 25 MG PO TABS
100.0000 mg | ORAL_TABLET | Freq: Every day | ORAL | Status: DC
  Administered 2015-10-19: 100 mg via ORAL
  Filled 2015-10-18: qty 4

## 2015-10-18 MED ORDER — VITAMIN B-1 100 MG PO TABS
100.0000 mg | ORAL_TABLET | Freq: Every day | ORAL | Status: DC
  Administered 2015-10-18 – 2015-10-22 (×5): 100 mg via ORAL
  Filled 2015-10-18 (×5): qty 1

## 2015-10-18 NOTE — ED Notes (Signed)
Report attempted 

## 2015-10-18 NOTE — ED Notes (Signed)
Report called and accepted

## 2015-10-18 NOTE — ED Provider Notes (Signed)
[facility] DEPT Provider Note   CSN: [id number] Arrival date & time: 10/18/15  1115     History   Chief Complaint Chief Complaint  Patient presents with  . Abdominal Pain    HPI Michael [name] is a 45 y.o. male.  HPI Patient has history of cirrhosis with significant ascites. Patient was seen as an outpatient by his gastroenterologist yesterday. They drew lab work. They were contacted today because his renal function has dropped precipitously. Patient reports he is still making a small amount of urine. He does report he has cut back on his fluid intake trying to decrease the amount of ascites and swelling that he has. He describes drinking a couple of cups of water yesterday and an oral juice. Cannot quantify the exact volume of fluid intake. Patient does intermittently have problems with nausea. More recently he states he has developed a couple of tender nodules. He reports one came up over his right, posterior iliac crest. Ports he has another nodule in the right biceps. He doesn't think he had any injuries that cause these things. Past Medical History:  Diagnosis Date  . Anxiety   . Ascites   . Cirrhosis of liver [facility])     Patient Active Problem List   Diagnosis Date Noted  . Multiple lung nodules on CT 09/02/2015  . Oral thrush 09/02/2015  . Dyspnea and respiratory abnormality 09/02/2015  . Liver failure without hepatic coma (HCC)   . Liver failure (HCC) 06/17/2015  . Alcoholic cirrhosis of liver with ascites (HCC) 06/17/2015  . Acute alcoholic hepatitis 06/17/2015  . Coagulopathy (HCC) 06/17/2015  . Macrocytic anemia 06/17/2015  . ARF (acute renal failure) (HCC) 06/17/2015  . Ascites 06/17/2015    Past Surgical History:  Procedure Laterality Date  . NO PAST SURGERIES     SURGERY AS INFANT PYLORIC STENOSIS  . OPEN REDUCTION INTERNAL FIXATION (ORIF) FOOT LISFRANC FRACTURE Right 08/06/2014   Procedure: RIGHT FOOT FRACTURE AND LISFRANC LIGAMENT INJURY FIXATION AND SUTURE  REMOVAL LEFT WRIST INCISION;  Surgeon: Nadara Mustard, MD;  Location: [facility] OR;  Service: Orthopedics;  Laterality: Right;  . ORIF WRIST FRACTURE Left 07/20/2014   Procedure: OPEN REDUCTION INTERNAL FIXATION (ORIF) WRIST FRACTURE;  Surgeon: Cammy Copa, MD;  Location: [facility] OR;  Service: Orthopedics;  Laterality: Left;  . PARACENTESIS    . SMALL INTESTINE SURGERY     as an infant       Home Medications    Prior to Admission medications   Medication Sig Start Date End Date Taking? Authorizing Provider  busPIRone (BUSPAR) 5 MG tablet Take 1 tablet (5 mg total) by mouth 2 (two) times daily. 08/18/15  Yes Henrietta Hoover, NP  folic acid (FOLVITE) 1 MG tablet Take 1 tablet (1 mg total) by mouth daily. 07/12/15  Yes Massie Maroon, FNP  furosemide (LASIX) 40 MG tablet Take 1 tablet (40 mg total) by mouth daily. Patient taking differently: Take 120 mg by mouth 2 (two) times daily.  07/12/15  Yes Massie Maroon, FNP  lactulose (CHRONULAC) 10 GM/15ML solution TAKE 15 MLS BY MOUTH TWICE DAILY 09/09/15  Yes Henrietta Hoover, NP  omeprazole (PRILOSEC) 20 MG capsule Take 20 mg by mouth 2 (two) times daily before a meal.   Yes Historical Provider, MD  pantoprazole (PROTONIX) 40 MG tablet Take 1 tablet (40 mg total) by mouth daily. 07/12/15  Yes Massie Maroon, FNP  predniSONE (DELTASONE) 10 MG tablet Take 10 mg by mouth daily with  breakfast. Pt has been taking for two weeks, 7 days left.  Goes back to dr on 9-27   Yes Historical Provider, MD  spironolactone (ALDACTONE) 100 MG tablet Take 200 mg by mouth daily.   Yes Historical Provider, MD  thiamine 100 MG tablet Take 1 tablet (100 mg total) by mouth daily. 07/12/15  Yes Massie Maroon, FNP  nystatin (MYCOSTATIN) 100000 UNIT/ML suspension Take 5 mLs (500,000 Units total) by mouth 4 (four) times daily. Patient not taking: Reported on 10/18/2015 09/02/15   Kalman Shan, MD  spironolactone (ALDACTONE) 50 MG tablet Take 1 tablet (50 mg total) by mouth  2 (two) times daily. Patient not taking: Reported on 10/18/2015 07/12/15   Massie Maroon, FNP  Tetrahydrozoline HCl (VISINE OP) Place 2 drops into both eyes as needed (for dry eyes).    Historical Provider, MD    Family History Family History  Problem Relation Age of Onset  . Prostate cancer Father   . Diabetes Mellitus II Paternal Grandfather     Social History Social History  Substance Use Topics  . Smoking status: Former Smoker    Packs/day: 1.00    Years: 18.00    Types: Cigarettes    Quit date: 06/11/2015  . Smokeless tobacco: Never Used  . Alcohol use No     Comment: stoped in May 2017      Allergies   Review of patient's allergies indicates no known allergies.   Review of Systems Review of Systems 10 Systems reviewed and are negative for acute change except as noted in the HPI.   Physical Exam Updated Vital Signs BP (!) 81/46 (BP Location: Left Arm)   Pulse (!) 123   Temp 97.7 F (36.5 C) (Oral)   Resp 18   Ht 5' 8.5" (1.74 m)   Wt 133 lb (60.3 kg)   SpO2 100%   BMI 19.93 kg/m   Physical Exam  Constitutional: He is oriented to person, place, and time.  Patient is deconditioned. He is alert and nontoxic. Mental status is clear. Color is good. No respiratory distress  HENT:  Head: Normocephalic and atraumatic.  Nose: Nose normal.  Mouth/Throat: Oropharynx is clear and moist.  Eyes: EOM are normal. Pupils are equal, round, and reactive to light.  mild scleral icterus  Neck: Neck supple.  Cardiovascular: Normal rate, regular rhythm, normal heart sounds and intact distal pulses.   Pulmonary/Chest: Effort normal and breath sounds normal.  Abdominal:  Abdominal distention. Ascites moderate to large. Abdomen however is not taut. There is no erythema. Nontender.  Musculoskeletal: Normal range of motion. He exhibits no edema or deformity.  Neurological: He is alert and oriented to person, place, and time. No cranial nerve deficit. He exhibits normal muscle  tone.  Skin: Skin is warm and dry.  Psychiatric: He has a normal mood and affect.     ED Treatments / Results  Labs (all labs ordered are listed, but only abnormal results are displayed) Labs Reviewed  LIPASE, BLOOD  COMPREHENSIVE METABOLIC PANEL  CBC  URINALYSIS, ROUTINE W REFLEX MICROSCOPIC (NOT AT [facility])  PROTIME-INR    EKG  EKG Interpretation None       Radiology No results found.  Procedures Procedures (including critical care time)  Medications Ordered in ED Medications - No data to display   Initial Impression / Assessment and Plan / ED Course  I have reviewed the triage vital signs and the nursing notes.  Pertinent labs & imaging results that were available during  my care of the patient were reviewed by me and considered in my medical decision making (see chart for details).  Clinical Course   Consult: (15:30) family practice resident consulted for patient admission. Case reviewed.  Final Clinical Impressions(s) / ED Diagnoses   Final diagnoses:  Hepatorenal failure (HCC)  Liver disease, chronic, with cirrhosis (HCC)  Patient has developed acute on chronic renal failure with severe advanced cirrhosis. Patient is alert. He has clear mental status without signs of encephalopathy at this time. He has purposely decreased fluid intake. This may be a contributing factor. He however also has severe advanced cirrhotic disease consistent with hepatorenal syndrome. Patient will be admitted for acute on chronic renal failure.  New Prescriptions New Prescriptions   No medications on file     Arby BarretteMarcy Krishawna Stiefel, MD 10/18/15 1536

## 2015-10-18 NOTE — ED Triage Notes (Signed)
Per Pt, Pt is coming from home with abdominal pain that started today. Pt reports more towards the left side with sharp intermittent pains. Pt's MD sent him in originally for abnormal kidney function labs. Denies vomiting or diarrhea in the last 24 hours, but reports nausea. Hx of Cirrhosis.

## 2015-10-18 NOTE — H&P (Signed)
[facility] History and Physical Service Pager: [phone]  Patient name: Michael [name] Medical record number: [id number] Date of birth: [birth year] Age: 45 y.o. Gender: male  Primary Care Provider: Massie MaroonHollis,Lachina M, FNP Consultants: GI Code Status: full  Chief Complaint: elevated kidney function tests as outpatient  Assessment and Plan: Michael [name] is a 45 y.o. male presenting with acute kidney failure. PMH is significant for Cirrhosis diagnosed in May 2017 and anxiety.   Acute Renal Failure Noted on outpatient labs yesterday via [facility] that creatinine had increased from 1 to 3, was instructed to come to ED for further evaluation. Creatinine here at 3.3. Patient reports taking high dose lasix and spironolactone at home to aid in preventing ascites. Has also been trying to restrict his fluid intake this past week to try to prevent fluid collection. Initial suspicion for pre-renal physiology in the setting of diuretic use and PO restriction. Given his advanced liver disease, hepatorenal syndrome should be considered although it is a diagnosis of exclusion.  -Admit to MedSurg, vital signs per unit  -IV fluids -trend BMP -strict I/Os  -urine sodium -if renal function does not improve with IVFs, consider treatment for hepatorenal syndrome   Alcoholic Cirrhosis Was diagnosed in May. MELD score of 33 with 19.6% estimated 3 month mortality. Last drink May 13th 2017. Last pericentesis on 9/9 where 3L removed, no signs of SBP. Follows closely with [facility] for labs. Hepatitis C and HIV negative. [facility] given severity of [facility] in short time span. Over past 2.5 weeks has been increase in fatigue. Has lost appetite past 5 days. Patient is mentating appropriately. CT abdomen from July 2017 showed cirrhosis with portal venous HTN, portal venous collaterals, esophageal varices and ascites. It was without hepatic lesions to suggest HCC or metastasis.  Although patient has leukocytosis (appears to be chronic for several months), his abdomen is not remarkably tender and he is afebrile so low suspicion for SBP but would recommend paracentesis for further evaluation.  -touch base with GI and obtain records from [facility]  -abdominal ultrasound to assess amount of ascites, would likely benefit from paracentesis  -may consider abdominal MRI to rule out hepatic malignancy -increase Lactulose with goal of 2-3 soft bowel movements a day -PT consult -continue home vitamins -continue home protonix -CIWA protocol   -will try IV lasix 60mg  tonight to see how diuresis is, holding home oral lasix at this time -can continue spironolactone at reduced dose of 100 mg  Hyponatremia: Na 124 at admission. Likely related to patient's cirrhosis. Appears to be chronically hyponatremic on previous labs. Patient has no neuro deficits on exam.  -continue to monitor mental status -follow on BMET   Pulmonary Nodules: Seen on CT chest from July 2017. Findings of numerous pulmonary nodules which could be suggestive of metastatic disease. Pulmonology saw patient in clinic. Recommended repeat CT in 2 months. AFP negative and autoimmune workup unremarkable.   Wrist Abscess Has been there for "a while", WBC chronically elevated, is 22 in ED today. Unlikely to be due to this wrist abscess but will treat -plan to I&D morning of 9/19  Thrush On chronic steroids, currently with thrush symptoms. HIV negative. Had been prescribed Nystatin outpatient but never filled Rx.  - nystatin oral solution  Anxiety Patient reports multiple stressors at home including divorce, death of a child, financial troubles on top of his new medical issues -continue home buspar 5mg  BID -consult CSW and CM   FEN/GI: regular diet  Prophylaxis: lovenox  Disposition: admit to med surg for treatment of renal failure and cirrhosis  History of Present Illness:  Michael [name] is a 45 y.o. male presenting  after found to have elevated Cr at outpatient GI follow up yesterday. Was called by Dr. Randa Evens who instructed patient to go to ED for AKI. Patient reports that previously his kidney function has been normal. He is seen by outpatient GI routinely and has BMETs drawn frequently.  Kidney function on labs Sept 8th was normal. Patient reports that he is compliant with his spironolactone and lasix. He has decreased his PO intake for the past 5 days in an attempt to restrict fluids to prevent accumulation of his ascites.    Diagnosed with liver cirrhosis in May.  Last drink was on Jun 11, 2015. Has had paracentesis performed about 6-7 times since May. Most recently done 2 weeks ago. He feels like his abdomen is very distended again.      Vomited small amount over the weekend and then yesterday. Decreased appetite for one week. Feels like abdomen is very distended again. Feeling more tired than usual. Takes lactulose twice per day. Reports a lot of gas. Has averaged about one bowel movement per day the past few days. Previously had been having two-three per day. Has had chronic abdominal pain in LUQ that is unchanged.    Review Of Systems: Per HPI  ROS  Patient Active Problem List   Diagnosis Date Noted  . Multiple lung nodules on CT 09/02/2015  . Oral thrush 09/02/2015  . Dyspnea and respiratory abnormality 09/02/2015  . Liver failure without hepatic coma (HCC)   . Liver failure (HCC) 06/17/2015  . Alcoholic cirrhosis of liver with ascites (HCC) 06/17/2015  . Acute alcoholic hepatitis 06/17/2015  . Coagulopathy (HCC) 06/17/2015  . Macrocytic anemia 06/17/2015  . ARF (acute renal failure) (HCC) 06/17/2015  . Ascites 06/17/2015    Past Medical History: Past Medical History:  Diagnosis Date  . Anxiety   . Ascites   . Cirrhosis of liver [facility])     Past Surgical History: Past Surgical History:  Procedure Laterality Date  . NO PAST SURGERIES     SURGERY AS INFANT PYLORIC STENOSIS  . OPEN  REDUCTION INTERNAL FIXATION (ORIF) FOOT LISFRANC FRACTURE Right 08/06/2014   Procedure: RIGHT FOOT FRACTURE AND LISFRANC LIGAMENT INJURY FIXATION AND SUTURE REMOVAL LEFT WRIST INCISION;  Surgeon: Nadara Mustard, MD;  Location: [facility] OR;  Service: Orthopedics;  Laterality: Right;  . ORIF WRIST FRACTURE Left 07/20/2014   Procedure: OPEN REDUCTION INTERNAL FIXATION (ORIF) WRIST FRACTURE;  Surgeon: Cammy Copa, MD;  Location: [facility] OR;  Service: Orthopedics;  Laterality: Left;  . PARACENTESIS    . SMALL INTESTINE SURGERY     as an infant    Social History: Social History  Substance Use Topics  . Smoking status: Former Smoker    Packs/day: 1.00    Years: 18.00    Types: Cigarettes    Quit date: 06/11/2015  . Smokeless tobacco: Never Used  . Alcohol use No     Comment: stoped in May 2017    Additional social history: No drug use. Lives alone. Fiance helps with his daily tasks. Able to perform most ADLs but occasionally does not assistance.    Please also refer to relevant sections of EMR.  Family History: Family History  Problem Relation Age of Onset  . Prostate cancer Father   . Diabetes Mellitus II Paternal Grandfather     Allergies  and Medications: No Known Allergies No current facility-administered medications on file prior to encounter.    Current Outpatient Prescriptions on File Prior to Encounter  Medication Sig Dispense Refill  . busPIRone (BUSPAR) 5 MG tablet Take 1 tablet (5 mg total) by mouth 2 (two) times daily. 60 tablet 1  . folic acid (FOLVITE) 1 MG tablet Take 1 tablet (1 mg total) by mouth daily. 30 tablet 3  . furosemide (LASIX) 40 MG tablet Take 1 tablet (40 mg total) by mouth daily. (Patient taking differently: Take 120 mg by mouth 2 (two) times daily. ) 30 tablet 3  . lactulose (CHRONULAC) 10 GM/15ML solution TAKE 15 MLS BY MOUTH TWICE DAILY 240 mL 3  . omeprazole (PRILOSEC) 20 MG capsule Take 20 mg by mouth 2 (two) times daily before a meal.    . pantoprazole  (PROTONIX) 40 MG tablet Take 1 tablet (40 mg total) by mouth daily. 30 tablet 3  . thiamine 100 MG tablet Take 1 tablet (100 mg total) by mouth daily. 30 tablet 3  . nystatin (MYCOSTATIN) 100000 UNIT/ML suspension Take 5 mLs (500,000 Units total) by mouth 4 (four) times daily. (Patient not taking: Reported on 10/18/2015) 100 mL 0  . spironolactone (ALDACTONE) 50 MG tablet Take 1 tablet (50 mg total) by mouth 2 (two) times daily. (Patient not taking: Reported on 10/18/2015) 60 tablet 3  . Tetrahydrozoline HCl (VISINE OP) Place 2 drops into both eyes as needed (for dry eyes).      Objective: BP (!) 81/46 (BP Location: Left Arm)   Pulse (!) 123   Temp 97.7 F (36.5 C) (Oral)   Resp 18   Ht 5' 8.5" (1.74 m)   Wt 133 lb (60.3 kg)   SpO2 100%   BMI 19.93 kg/m  Exam: General: thin, chronically ill appearing man, laying in bed, in no acute distress Eyes: Jaundice, EOMI ENTM: white plaques on tongue, dry cracked lips, MMM  Cardiovascular: RRR no murmurs rubs or gallops, peripheral pulses in tact Respiratory: CTA bilaterally no increased work of breathing noted Gastrointestinal: markedly distended abdomen, faint bowel sounds, + fluid wave, mild tendenessr to palpation in LUQ, abdomen is not tense, hepatomegaly appreciated   MSK: warm, well perfused, normal ROM in upper and lower extremities bilateral  Derm: warm, dry, pale, bruises noted over forearms and legs, abscess of right wrist approximately 2x2 cm with overlying erythema and scabbing  Neuro: no focal deficits, alert and oriented  Psych: appropriate mood and affect  Labs and Imaging: CBC BMET  No results for input(s): WBC, HGB, HCT, PLT in the last 168 hours. No results for input(s): NA, K, CL, CO2, BUN, CREATININE, GLUCOSE, CALCIUM in the last 168 hours.    Tillman Sers, DO 10/18/2015, 4:50 PM PGY-1, [facility] FPTS Intern pager: [phone], text pages welcome  Upper Level Addendum:  I have seen and evaluated  this patient along with Dr. Wonda Olds and reviewed the above note, making necessary revisions in green.   Marcy Siren, D.O. 10/18/2015, 6:25 PM PGY-2,  Family Medicine

## 2015-10-19 ENCOUNTER — Observation Stay (HOSPITAL_COMMUNITY): Payer: Medicaid Other

## 2015-10-19 DIAGNOSIS — D72829 Elevated white blood cell count, unspecified: Secondary | ICD-10-CM | POA: Diagnosis not present

## 2015-10-19 DIAGNOSIS — K767 Hepatorenal syndrome: Secondary | ICD-10-CM | POA: Diagnosis not present

## 2015-10-19 DIAGNOSIS — R918 Other nonspecific abnormal finding of lung field: Secondary | ICD-10-CM | POA: Diagnosis present

## 2015-10-19 DIAGNOSIS — R251 Tremor, unspecified: Secondary | ICD-10-CM | POA: Diagnosis not present

## 2015-10-19 DIAGNOSIS — Z8042 Family history of malignant neoplasm of prostate: Secondary | ICD-10-CM | POA: Diagnosis not present

## 2015-10-19 DIAGNOSIS — E872 Acidosis: Secondary | ICD-10-CM | POA: Diagnosis present

## 2015-10-19 DIAGNOSIS — E869 Volume depletion, unspecified: Secondary | ICD-10-CM | POA: Diagnosis not present

## 2015-10-19 DIAGNOSIS — N179 Acute kidney failure, unspecified: Secondary | ICD-10-CM | POA: Diagnosis present

## 2015-10-19 DIAGNOSIS — R000 Tachycardia, unspecified: Secondary | ICD-10-CM | POA: Diagnosis not present

## 2015-10-19 DIAGNOSIS — K7031 Alcoholic cirrhosis of liver with ascites: Secondary | ICD-10-CM | POA: Diagnosis present

## 2015-10-19 DIAGNOSIS — F419 Anxiety disorder, unspecified: Secondary | ICD-10-CM | POA: Diagnosis present

## 2015-10-19 DIAGNOSIS — R188 Other ascites: Secondary | ICD-10-CM | POA: Diagnosis not present

## 2015-10-19 DIAGNOSIS — B370 Candidal stomatitis: Secondary | ICD-10-CM | POA: Diagnosis present

## 2015-10-19 DIAGNOSIS — K652 Spontaneous bacterial peritonitis: Secondary | ICD-10-CM | POA: Diagnosis present

## 2015-10-19 DIAGNOSIS — I9589 Other hypotension: Secondary | ICD-10-CM

## 2015-10-19 DIAGNOSIS — Z87891 Personal history of nicotine dependence: Secondary | ICD-10-CM | POA: Diagnosis not present

## 2015-10-19 DIAGNOSIS — E871 Hypo-osmolality and hyponatremia: Secondary | ICD-10-CM | POA: Diagnosis present

## 2015-10-19 DIAGNOSIS — L02413 Cutaneous abscess of right upper limb: Secondary | ICD-10-CM | POA: Diagnosis present

## 2015-10-19 DIAGNOSIS — I959 Hypotension, unspecified: Secondary | ICD-10-CM

## 2015-10-19 DIAGNOSIS — Z833 Family history of diabetes mellitus: Secondary | ICD-10-CM | POA: Diagnosis not present

## 2015-10-19 DIAGNOSIS — K7290 Hepatic failure, unspecified without coma: Secondary | ICD-10-CM | POA: Diagnosis present

## 2015-10-19 DIAGNOSIS — R109 Unspecified abdominal pain: Secondary | ICD-10-CM | POA: Diagnosis present

## 2015-10-19 DIAGNOSIS — N189 Chronic kidney disease, unspecified: Secondary | ICD-10-CM | POA: Diagnosis not present

## 2015-10-19 DIAGNOSIS — R278 Other lack of coordination: Secondary | ICD-10-CM | POA: Diagnosis not present

## 2015-10-19 DIAGNOSIS — G8929 Other chronic pain: Secondary | ICD-10-CM | POA: Diagnosis not present

## 2015-10-19 DIAGNOSIS — Z79899 Other long term (current) drug therapy: Secondary | ICD-10-CM | POA: Diagnosis not present

## 2015-10-19 DIAGNOSIS — F1010 Alcohol abuse, uncomplicated: Secondary | ICD-10-CM | POA: Diagnosis not present

## 2015-10-19 DIAGNOSIS — Z7952 Long term (current) use of systemic steroids: Secondary | ICD-10-CM | POA: Diagnosis not present

## 2015-10-19 LAB — COMPREHENSIVE METABOLIC PANEL
ALT: 32 U/L (ref 17–63)
AST: 43 U/L — ABNORMAL HIGH (ref 15–41)
Albumin: 1.8 g/dL — ABNORMAL LOW (ref 3.5–5.0)
Alkaline Phosphatase: 276 U/L — ABNORMAL HIGH (ref 38–126)
Anion gap: 9 (ref 5–15)
BUN: 55 mg/dL — ABNORMAL HIGH (ref 6–20)
CHLORIDE: 99 mmol/L — AB (ref 101–111)
CO2: 17 mmol/L — ABNORMAL LOW (ref 22–32)
CREATININE: 2.75 mg/dL — AB (ref 0.61–1.24)
Calcium: 8.2 mg/dL — ABNORMAL LOW (ref 8.9–10.3)
GFR, EST AFRICAN AMERICAN: 30 mL/min — AB (ref >60)
GFR, EST NON AFRICAN AMERICAN: 26 mL/min — AB (ref >60)
Glucose, Bld: 98 mg/dL (ref 65–99)
POTASSIUM: 4.7 mmol/L (ref 3.5–5.1)
Sodium: 125 mmol/L — ABNORMAL LOW (ref 135–145)
Total Bilirubin: 3.4 mg/dL — ABNORMAL HIGH (ref 0.3–1.2)
Total Protein: 4.8 g/dL — ABNORMAL LOW (ref 6.5–8.1)

## 2015-10-19 LAB — CBC
HCT: 37.9 % — ABNORMAL LOW (ref 39.0–52.0)
Hemoglobin: 13 g/dL (ref 13.0–17.0)
MCH: 32.9 pg (ref 26.0–34.0)
MCHC: 34.3 g/dL (ref 30.0–36.0)
MCV: 95.9 fL (ref 78.0–100.0)
PLATELETS: 227 10*3/uL (ref 150–400)
RBC: 3.95 MIL/uL — ABNORMAL LOW (ref 4.22–5.81)
RDW: 14 % (ref 11.5–15.5)
WBC: 20.3 10*3/uL — ABNORMAL HIGH (ref 4.0–10.5)

## 2015-10-19 LAB — AMMONIA: Ammonia: 70 umol/L — ABNORMAL HIGH (ref 9–35)

## 2015-10-19 LAB — URINALYSIS, ROUTINE W REFLEX MICROSCOPIC
Bilirubin Urine: NEGATIVE
GLUCOSE, UA: NEGATIVE mg/dL
HGB URINE DIPSTICK: NEGATIVE
Ketones, ur: NEGATIVE mg/dL
LEUKOCYTES UA: NEGATIVE
Nitrite: NEGATIVE
PH: 5 (ref 5.0–8.0)
PROTEIN: NEGATIVE mg/dL
Specific Gravity, Urine: 1.015 (ref 1.005–1.030)

## 2015-10-19 LAB — SODIUM, URINE, RANDOM: Sodium, Ur: <10 mmol/L

## 2015-10-19 LAB — LACTATE DEHYDROGENASE: LDH: 219 U/L — ABNORMAL HIGH (ref 98–192)

## 2015-10-19 LAB — LACTIC ACID, PLASMA
LACTIC ACID, VENOUS: 2.2 mmol/L — AB (ref 0.5–1.9)
LACTIC ACID, VENOUS: 2.4 mmol/L — AB (ref 0.5–1.9)
LACTIC ACID, VENOUS: 2.5 mmol/L — AB (ref 0.5–1.9)
LACTIC ACID, VENOUS: 2.8 mmol/L — AB (ref 0.5–1.9)
Lactic Acid, Venous: 2.9 mmol/L (ref 0.5–1.9)

## 2015-10-19 MED ORDER — SODIUM CHLORIDE 0.9 % IV BOLUS (SEPSIS)
500.0000 mL | Freq: Once | INTRAVENOUS | Status: AC
  Administered 2015-10-19: 500 mL via INTRAVENOUS

## 2015-10-19 MED ORDER — SODIUM CHLORIDE 0.9 % IV SOLN
50.0000 ug/h | INTRAVENOUS | Status: DC
  Administered 2015-10-19 – 2015-10-22 (×7): 50 ug/h via INTRAVENOUS
  Filled 2015-10-19 (×16): qty 1

## 2015-10-19 MED ORDER — DEXTROSE 5 % IV SOLN
1.0000 g | INTRAVENOUS | Status: DC
  Administered 2015-10-19 – 2015-10-22 (×4): 1 g via INTRAVENOUS
  Filled 2015-10-19 (×4): qty 10

## 2015-10-19 MED ORDER — MIDODRINE HCL 5 MG PO TABS
10.0000 mg | ORAL_TABLET | Freq: Three times a day (TID) | ORAL | Status: DC
  Administered 2015-10-19 – 2015-10-20 (×2): 10 mg via ORAL
  Filled 2015-10-19 (×2): qty 2

## 2015-10-19 MED ORDER — ALBUMIN HUMAN 25 % IV SOLN
50.0000 g | Freq: Two times a day (BID) | INTRAVENOUS | Status: DC
  Administered 2015-10-19 – 2015-10-20 (×2): 50 g via INTRAVENOUS
  Filled 2015-10-19 (×4): qty 200

## 2015-10-19 MED ORDER — BUSPIRONE HCL 15 MG PO TABS
7.5000 mg | ORAL_TABLET | Freq: Two times a day (BID) | ORAL | Status: DC
  Administered 2015-10-19 – 2015-10-22 (×6): 7.5 mg via ORAL
  Filled 2015-10-19 (×6): qty 1

## 2015-10-19 NOTE — Progress Notes (Addendum)
CRITICAL VALUE ALERT  Critical value received:  Lactic acid 2.8    Date of notification:  10/19/15  Time of notification:  0830  Critical value read back:Yes.    Nurse who received alert:  Malakhi Markwood   MD notified (1st page): Neal's team  Time of first page:  0830  MD notified (2nd page):   Time of second page:  Responding MD:  Dr. Abelardo DieselMcMullen  Time MD responded:  [phone]

## 2015-10-19 NOTE — Consult Note (Addendum)
Subjective:   HPI  The patient is a 45 year old male who was discharged from this hospital in May after being here with a diagnosis of alcoholic cirrhosis of the liver, and alcoholic hepatitis. He had ascites. After discharge from the hospital he states he quit drinking alcohol. He has followed up in the office with Dr. Randa Evens who has been managing his liver disease and his ascites. He has been on Lasix and Aldactone. He went yesterday to have lab work drawn and was found to have an elevation of his creatinine and BUN. He was told to come to the hospital for further evaluation because of renal problems. According to the patient and his family he has not been doing well with diuretics in that the ascites keep reaccumulating. Despite being on Lasix and spironolactone he has had a total of 6 paracentesis.  Review of Systems Denies shortness of breath or chest pain. Complains of reaccumulation of ascites  Past Medical History:  Diagnosis Date  . Anxiety   . Ascites   . Cirrhosis of liver [facility])    Past Surgical History:  Procedure Laterality Date  . NO PAST SURGERIES     SURGERY AS INFANT PYLORIC STENOSIS  . OPEN REDUCTION INTERNAL FIXATION (ORIF) FOOT LISFRANC FRACTURE Right 08/06/2014   Procedure: RIGHT FOOT FRACTURE AND LISFRANC LIGAMENT INJURY FIXATION AND SUTURE REMOVAL LEFT WRIST INCISION;  Surgeon: Nadara Mustard, MD;  Location: [facility] OR;  Service: Orthopedics;  Laterality: Right;  . ORIF WRIST FRACTURE Left 07/20/2014   Procedure: OPEN REDUCTION INTERNAL FIXATION (ORIF) WRIST FRACTURE;  Surgeon: Cammy Copa, MD;  Location: [facility] OR;  Service: Orthopedics;  Laterality: Left;  . PARACENTESIS    . SMALL INTESTINE SURGERY     as an infant   Social History   Social History  . Marital status: Single    Spouse name: N/A  . Number of children: N/A  . Years of education: N/A   Occupational History  . self employeed     mason   Social History Main Topics  . Smoking status: Former Smoker     Packs/day: 1.00    Years: 18.00    Types: Cigarettes    Quit date: 06/11/2015  . Smokeless tobacco: Never Used  . Alcohol use No     Comment: stoped in May 2017   . Drug use: No  . Sexual activity: Not on file   Other Topics Concern  . Not on file   Social History Narrative  . No narrative on file   family history includes Diabetes Mellitus II in his paternal grandfather; Prostate cancer in his father.  Current Facility-Administered Medications:  .  0.9 %  sodium chloride infusion, , Intravenous, Continuous, Arvilla Market, DO, Last Rate: 100 mL/hr at 10/19/15 0226 .  busPIRone (BUSPAR) tablet 7.5 mg, 7.5 mg, Oral, BID, Elmon Else McMullen, DO .  enoxaparin (LOVENOX) injection 30 mg, 30 mg, Subcutaneous, Q24H, Arvilla Market, DO, 30 mg at 10/18/15 2033 .  folic acid (FOLVITE) tablet 1 mg, 1 mg, Oral, Daily, Bonney Aid, MD, 1 mg at 10/19/15 0951 .  lactulose (CHRONULAC) 10 GM/15ML solution 20 g, 20 g, Oral, BID, Arvilla Market, DO, 20 g at 10/19/15 0951 .  multivitamin with minerals tablet 1 tablet, 1 tablet, Oral, Daily, Arvilla Market, DO, 1 tablet at 10/19/15 [phone] .  nystatin (MYCOSTATIN) 100000 UNIT/ML suspension 500,000 Units, 5 mL, Oral, QID, Arvilla Market, DO, 500,000 Units at 10/19/15 [phone] .  ondansetron (ZOFRAN) tablet 4 mg, 4 mg, Oral, Q6H PRN **OR** ondansetron (ZOFRAN) injection 4 mg, 4 mg, Intravenous, Q6H PRN, Arvilla Marketatherine Lauren Wallace, DO .  pantoprazole (PROTONIX) EC tablet 40 mg, 40 mg, Oral, Daily, Arvilla Marketatherine Lauren Wallace, DO, 40 mg at 10/19/15 0951 .  predniSONE (DELTASONE) tablet 10 mg, 10 mg, Oral, Q breakfast, Arvilla Marketatherine Lauren Wallace, DO, 10 mg at 10/19/15 [id number] .  spironolactone (ALDACTONE) tablet 100 mg, 100 mg, Oral, Daily, Arvilla Marketatherine Lauren Wallace, DO, 100 mg at 10/19/15 0951 .  thiamine (VITAMIN B-1) tablet 100 mg, 100 mg, Oral, Daily, 100 mg at 10/19/15 0951 **OR** thiamine (B-1) injection 100 mg, 100 mg,  Intravenous, Daily, Alyssa A Haney, MD No Known Allergies   Objective:     BP (!) 94/59 (BP Location: Left Arm)   Pulse (!) 116   Temp 98.2 F (36.8 C)   Resp 19   Ht 5' 8.5" (1.74 m)   Wt 62.1 kg (136 lb 12.8 oz)   SpO2 99%   BMI 20.50 kg/m   He is somewhat somnolent  Heart regular rhythm no murmurs  Lungs clear  Abdomen: Bowel sounds are present, distended with ascites, which is somewhat tense  Extremities without edema    Laboratory No components found for: D1    Assessment:     #1. Alcoholic liver disease (cirrhosis, and history of alcoholic hepatitis in the past.)  #2. Refractory ascites. His ascites continues to reaccumulate requiring large volume paracentesis despite his use of Lasix and Aldactone.  #3. Elevated BUN and creatinine. This must be due to intravascular volume depletion from diuretic therapy.  #4. Hepatic encephalopathy      Plan:     This patient has demonstrated that he has refractory ascites. I would recommend stopping diuretics at this time. Hopefully this will improve his intravascular volume and improve his renal function. Continue to follow renal function daily. To manage his ascites will more than likely required repeated large-volume paracentesis. Recommend also a 2 g sodium diet.I would also avoid excess sodium in any IV fluids once stable with improvement of creatinine and BUN, as he will simply retain this in the form of ascites. He has already stopped drinking alcohol which of course is essential. If we don't see improvement of renal function and a day or 2 I would ask nephrology to see him. Given his somnolence and elevated serum ammonia, I would agree that he should remain on lactulose, and consider adding Xifaxan. We will follow.

## 2015-10-19 NOTE — Progress Notes (Signed)
Family Medicine Teaching Service Daily Progress Note Intern Pager: [phone]  Patient name: Michael [name] Medical record number: [id number] Date of birth: November 04, 1970 Age: 45 y.o. Gender: male  Primary Care Provider: Massie MaroonHollis,Lachina M, FNP Consultants: GI Code Status: FULL  Pt Overview and Major Events to Date:  9/19 admitted to FPTS  Assessment and Plan:  Michael [name] is a 45 y.o. male presenting with acute kidney failure. PMH is significant for Cirrhosis diagnosed in May 2017 and anxiety.   Acute Renal Failure Noted on outpatient labs yesterday via [facility] that creatinine had increased from 1 to 3, was instructed to come to ED for further evaluation. Creatinine in ED 3.3. Patient reports taking high dose lasix and spironolactone at home to aid in preventing ascites. Has also been trying to restrict his fluid intake in the week prior to admission to try to prevent fluid collection. Initial suspicion for pre-renal physiology in the setting of diuretic use and PO restriction. Given his advanced liver disease, hepatorenal syndrome should be considered although it is a diagnosis of exclusion.  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 MELD score of 33 with 19.6% estimated 3 month mortality. Last drink May 13th 2017. Last pericentesis on 9/9 where 3L removed, no signs of SBP. Follows closely with [facility] for labs. Hepatitis C and HIV negative. [facility] given severity of cirrhosis in short time span. Over past 2.5 weeks has been increase in fatigue. Has lost appetite past 5 days. Patient is  mentating appropriately. CT abdomen from July 2017 showed cirrhosis with portal venous HTN, portal venous collaterals, esophageal varices and ascites. It was without hepatic lesions to suggest HCC or metastasis. Although patient has leukocytosis (appears to be chronic for several months), his abdomen is not remarkably tender and he is afebrile so low suspicion for SBP but would recommend paracentesis for further evaluation.  -consult to [facility], appreciate recommendations -abdominal ultrasound demonstrates moderate ascites, would likely benefit from paracentesis  -may consider abdominal MRI to rule out hepatic malignancy -Lactulose 20mg  BID with goal of 2-3 soft bowel movements a day -PT consult for weakness -continue home prednisone taper (10mg  daily) -continue home vitamins -continue home protonix -continue CIWA scores but discontinued Ativan -holding home oral lasix at this time, had one time dose of 60mg  IV lasix last night (half of his home dose of 240mg  daily) and patient is likely very depleted intravascularly  -can continue spironolactone at reduced dose of 100 mg  Lactic acidosis Elevated to 2.5 overnight, trended to 2.2 and 2.8 this morning. Patient without signs of infection, likely elevated due to disease process/inflammation -continue to trend  Hyponatremia: Na 124 at admission. Likely related to patient's cirrhosis. Appears to be chronically hyponatremic on previous labs. Patient has no neuro deficits on exam.  -chronically low in setting of cirrhosis -continue to monitor mental status -follow on BMET, Na 125 today  Pulmonary Nodules: Seen on CT chest from July 2017. Findings of numerous pulmonary nodules which could be suggestive of metastatic disease. Pulmonology saw patient in clinic. Recommended repeat CT in 2 months. AFP negative and autoimmune workup  unremarkable.  -repeat chest CT outpatient as scheduled  Wrist Abscess Has been there for "a while", WBC chronically  elevated, is 22 in ED today, patient has also been on steroids per GI for several months. Unlikely to be due to this wrist abscess but will treat -plan to I&D morning of 9/19  Thrush On chronic steroids, currently with thrush symptoms. HIV negative. Had been prescribed Nystatin outpatient but never filled Rx.  - nystatin oral solution  Anxiety Patient reports multiple stressors at home including divorce, death of a child, financial troubles on top of his new medical issues -continue home buspar 5mg  BID -consult CSW and CM   FEN/GI: regular diet Prophylaxis: lovenox  Disposition: admit to med surg for treatment of renal failure and cirrhosis  Subjective:  [name] is tired this morning, denies abdominal pain, dizziness, confusion. Fiance is at bedside.  Objective: Temp:  [97.5 F (36.4 C)-98.3 F (36.8 C)] 98.2 F (36.8 C) (09/20 0641) Pulse Rate:  [105-123] 116 (09/20 0641) Resp:  [18-19] 19 (09/20 0641) BP: (74-122)/(46-78) 94/59 (09/20 0641) SpO2:  [96 %-100 %] 99 % (09/20 0641) Weight:  [133 lb (60.3 kg)-136 lb 12.8 oz (62.1 kg)] 136 lb 12.8 oz (62.1 kg) (09/20 5784) Physical Exam: General: chronically ill appearing, sleeping in bed, easily arousable Cardiovascular: RRR no murmurs rubs or gallops Respiratory: CTA bilaterally with normal work of breathing Abdomen: distended, faint bowel sounds, non-tender, + fluid wave Extremities: no edema or cyanosis Skin: abscess of right wrist with erythema and scabbing, bruises notes over forearms and legs      Laboratory:  Recent Labs Lab 10/18/15 1641 10/19/15 0220  WBC 23.0* 20.3*  HGB 14.7 13.0  HCT 41.3 37.9*  PLT 260 227    Recent Labs Lab 10/18/15 1641 10/18/15 2238 10/19/15 0220  NA 124* 125* 125*  K 5.3* 4.7 4.7  CL 92* 96* 99*  CO2 19* 17* 17*  BUN 59* 55* 55*  CREATININE 3.39* 2.78* 2.75*  CALCIUM 8.8* 8.4* 8.2*  PROT 5.8*  --  4.8*  BILITOT 3.6*  --  3.4*  ALKPHOS 329*  --  276*  ALT 39  --   32  AST 51*  --  43*  GLUCOSE 90 101* 98   Ammonia: 70 Lactic Acid: 2.5 > 2.2 > 2.8  Imaging/Diagnostic Tests: US Abdomen Limited  Result Date: 10/19/2015 CLINICAL DATA:  Assess ascites. Acute onset of abdominal distention, with current history of cirrhosis. Initial encounter. EXAM: LIMITED ABDOMEN ULTRASOUND FOR ASCITES TECHNIQUE: Limited ultrasound survey for ascites was performed in all four abdominal quadrants. COMPARISON:  Paracentesis images performed 10/04/2015 FINDINGS: Moderate volume ascites is noted within all 4 quadrants of the abdomen. IMPRESSION: Moderate volume ascites noted within all 4 quadrants of the abdomen. Electronically Signed   By: Roanna Raider M.D.   On: 10/19/2015 02:38    Tillman Sers, DO 10/19/2015, 8:58 AM PGY-1, [facility] FPTS Intern pager: [phone], text pages welcome

## 2015-10-19 NOTE — Significant Event (Signed)
Rapid Response Event Note RN called for hypotension Overview: Time Called: 2200 Arrival Time: 2200 Event Type: Hypotension  Initial Focused Assessment: Pt lethargic aroused to verbal stimuli and light tactile stimulation, pt would fall back asleep quickly. Pt alert and oriented x4. Skin warm and dry. BP 74/48, HR 120, RR 19, 99% RA. Pt had received 2 mg Ativan IVP PTA for CIWA 13. Pt denies any chest/abd pain or SOB. 500 cc Bolus of NS given. BP increased to 91/56, HR 117.   Interventions: Collected Amonia (70) and Lactic Acid (2.5, 2.2) blood work. EKG completed resulting in ST.  Results given to Dr. Kennon RoundsHaney.   Plan of Care (if not transferred): Continue to monitor pt, decrease amount of Ativan adminsitered. Lactic Acid and Amonia labs collected. Nikki RN aware of plan for pt.    Event Summary: Name of Physician Notified: Dr. Chanetta Marshallimberlake  at 2209    at    Outcome: Stayed in room and stabalized     Rocky Boy's AgencySHULAR, Michael [name] Carson ValleyPaige

## 2015-10-19 NOTE — Progress Notes (Signed)
Report called to receiving nurse on 2C. Family at bedside and notified patient moving to 2c15. Julien NordmannJackson, Luciann Gossett [facility] L[facility]ika

## 2015-10-19 NOTE — Progress Notes (Signed)
Interim Progress Note:  Called by Rapid Response RN to evaluate patient due to somnolence and hypotension 74/48 and tachycardia to 120. Patient received Ativan 4 hours ago for CIWA score of 13, scoring for anxiety and tremor. Patient is somnolent but arousable on my exam, and orientated x 3. No pain at all by patient report or my exam, particularly abdominal exam.   Assessment & Plan:  -EKG sinus tachycardia -Ammonia level drawn 2/2 asterixis -500 cc bolus given -Vitals are not significantly different from admission vitals. Somnolence likely caused by recent Ativan dose in the setting of AKI.  -As pt is afebrile, and abdominal exam is not consistent with SBP, will hold on activating sepsis protocol and starting antibiotics at this time. Does have small R wrist abscess, which has some surrounding erythema, but this looks better than it has recently.  -If patient continues to have somnolence and persistent hypotension after 500cc bolus, will start antibiotics.

## 2015-10-19 NOTE — Evaluation (Signed)
Physical Therapy Evaluation Patient Details Name: Michael [name] MRN: [id number] DOB: [birth year] Today's Date: 10/19/2015   History of Present Illness  Michael [name] a 45 y.o.malepresenting with acute kidney failure. PMH is significant for Cirrhosis diagnosed in May 2017 and anxiety.   Clinical Impression  Patient presents with decreased mobility due to deficits listed in PT problem list.  He will benefit from skilled PT in the acute setting to allow return home with family support and follow up HHPT.     Follow Up Recommendations Home health PT    Equipment Recommendations  Rolling walker with 5" wheels    Recommendations for Other Services       Precautions / Restrictions Precautions Precautions: Fall Precaution Comments: watch BP      Mobility  Bed Mobility Overal bed mobility: Modified Independent                Transfers Overall transfer level: Needs assistance   Transfers: Sit to/from Stand Sit to Stand: Min guard         General transfer comment: assist for balance  Ambulation/Gait Ambulation/Gait assistance: Min assist Ambulation Distance (Feet): 20 Feet Assistive device: None Gait Pattern/deviations: Step-to pattern;Decreased stride length;Shuffle     General Gait Details: unsteady on his feet so assist for safety/balance  Stairs            Wheelchair Mobility    Modified Rankin (Stroke Patients Only)       Balance Overall balance assessment: Needs assistance   Sitting balance-Leahy Scale: Good     Standing balance support: No upper extremity supported Standing balance-Leahy Scale: Fair Standing balance comment: static stance without LOB no UE support                             Pertinent Vitals/Pain Pain Assessment: Faces Faces Pain Scale: Hurts little more Pain Location: R bicep and R hip area of swelling feels like deep bruise Pain Descriptors / Indicators: Sore Pain Intervention(s): Monitored during  session;Repositioned    Home Living Family/patient expects to be discharged to:: Private residence Living Arrangements: Spouse/significant other (fiance) Available Help at Discharge: Family Type of Home: House Home Access: Stairs to enter Entrance Stairs-Rails: None Secretary/administrator of Steps: 2 Home Layout: One level Home Equipment: Wheelchair - manual      Prior Function Level of Independence: Needs assistance   Gait / Transfers Assistance Needed: was just going bed to bathroom to couch over past couple of weeks per fiance', but was more independent up until then getting up into his tall truck and getting around on his own           Hand Dominance   Dominant Hand: Right    Extremity/Trunk Assessment   Upper Extremity Assessment: Generalized weakness           Lower Extremity Assessment: Generalized weakness      Cervical / Trunk Assessment: Other exceptions  Communication   Communication: No difficulties  Cognition Arousal/Alertness: Awake/alert Behavior During Therapy: WFL for tasks assessed/performed Overall Cognitive Status: Within Functional Limits for tasks assessed                      General Comments General comments (skin integrity, edema, etc.): Also with errythema surrounding open sore on dorsum of R wrist    Exercises     Assessment/Plan    PT Assessment Patient needs continued PT services  PT Problem List Decreased mobility;Decreased  strength;Decreased safety awareness;Decreased balance;Decreased knowledge of use of DME;Decreased activity tolerance;Cardiopulmonary status limiting activity;Pain          PT Treatment Interventions DME instruction;Gait training;Therapeutic activities;Therapeutic exercise;Stair training;Balance training;Functional mobility training;Patient/family education    PT Goals (Current goals can be found in the Care Plan section)  Acute Rehab PT Goals Patient Stated Goal: To go home PT Goal  Formulation: With patient/family Time For Goal Achievement: 10/26/15 Potential to Achieve Goals: Good    Frequency Min 3X/week   Barriers to discharge        Co-evaluation               End of Session Equipment Utilized During Treatment: Gait belt Activity Tolerance: Patient limited by fatigue;Treatment limited secondary to medical complications (Comment) (elevated HR) Patient left: in bed;with call bell/phone within reach;with family/visitor present      Functional Assessment Tool Used: Clinical Judgement Functional Limitation: Mobility: Walking and moving around Mobility: Walking and Moving Around Current Status ([id number]): At least 20 percent but less than 40 percent impaired, limited or restricted Mobility: Walking and Moving Around Goal Status [phone]([id number]): At least 1 percent but less than 20 percent impaired, limited or restricted    Time: 0905-0928 PT Time Calculation (min) (ACUTE ONLY): 23 min   Charges:   PT Evaluation $PT Eval Moderate Complexity: 1 Procedure PT Treatments $Gait Training: 8-22 mins   PT G Codes:   PT G-Codes **NOT FOR INPATIENT CLASS** Functional Assessment Tool Used: Clinical Judgement Functional Limitation: Mobility: Walking and moving around Mobility: Walking and Moving Around Current Status ([id number](G8978): At least 20 percent but less than 40 percent impaired, limited or restricted Mobility: Walking and Moving Around Goal Status [phone]([id number]): At least 1 percent but less than 20 percent impaired, limited or restricted    Elray McgregorCynthia Wynn 10/19/2015, 10:17 AM  Sheran Lawlessyndi Wynn, PT [phone] 10/19/2015

## 2015-10-19 NOTE — Consult Note (Addendum)
PULMONARY / CRITICAL CARE MEDICINE   Name: Michael [name] MRN: [id number] DOB: [birth year]    ADMISSION DATE:  10/18/2015 CONSULTATION DATE:  9/20  REFERRING MD:  FPTS   CHIEF COMPLAINT:  Hepatorenal syndrome, hypotension   HISTORY OF PRESENT ILLNESS:   45yo male with hx ETOH abuse who was recently dx with alcoholic cirrhosis and alcoholic hepatitis in May 2017 c/b recurrent ascites despite lasix and spironolactone requiring multiple large volume paracentesis. He reports that he stopped drinking after this dx and has been restricting his fluids in an attempt to avoid reaccumulation of ascites. On outpt GI f/u he was noted to have AKI with Scr 3.3 and was sent to ER for further eval.    Pt currently denies pain, SOB, chest pain.  Mental status is improved per mom.  Feels ascites/abd distension is "the same".   PAST MEDICAL HISTORY :  He  has a past medical history of Anxiety; Ascites; and Cirrhosis of liver (HCC).  PAST SURGICAL HISTORY: He  has a past surgical history that includes No past surgeries; ORIF wrist fracture (Left, 07/20/2014); Small intestine surgery; Open reduction internal fixation (orif) foot lisfranc fracture (Right, 08/06/2014); and Paracentesis.  No Known Allergies  No current facility-administered medications on file prior to encounter.    Current Outpatient Prescriptions on File Prior to Encounter  Medication Sig  . busPIRone (BUSPAR) 5 MG tablet Take 1 tablet (5 mg total) by mouth 2 (two) times daily.  . folic acid (FOLVITE) 1 MG tablet Take 1 tablet (1 mg total) by mouth daily.  . furosemide (LASIX) 40 MG tablet Take 1 tablet (40 mg total) by mouth daily. (Patient taking differently: Take 120 mg by mouth 2 (two) times daily. )  . lactulose (CHRONULAC) 10 GM/15ML solution TAKE 15 MLS BY MOUTH TWICE DAILY  . omeprazole (PRILOSEC) 20 MG capsule Take 20 mg by mouth 2 (two) times daily before a meal.  . pantoprazole (PROTONIX) 40 MG tablet Take 1 tablet (40 mg total) by  mouth daily.  Marland Kitchen thiamine 100 MG tablet Take 1 tablet (100 mg total) by mouth daily.  Marland Kitchen nystatin (MYCOSTATIN) 100000 UNIT/ML suspension Take 5 mLs (500,000 Units total) by mouth 4 (four) times daily. (Patient not taking: Reported on 10/18/2015)  . spironolactone (ALDACTONE) 50 MG tablet Take 1 tablet (50 mg total) by mouth 2 (two) times daily. (Patient not taking: Reported on 10/18/2015)  . Tetrahydrozoline HCl (VISINE OP) Place 2 drops into both eyes as needed (for dry eyes).    FAMILY HISTORY:  His indicated that the status of his father is unknown. He indicated that the status of his paternal grandfather is unknown.    SOCIAL HISTORY: He  reports that he quit smoking about 4 months ago. His smoking use included Cigarettes. He has a 18.00 pack-year smoking history. He has never used smokeless tobacco. He reports that he does not drink alcohol or use drugs.  REVIEW OF SYSTEMS:   As per HPI - All other systems reviewed and were neg.    SUBJECTIVE:    VITAL SIGNS: BP (!) 92/54 (BP Location: Left Arm)   Pulse 68   Temp 98.1 F (36.7 C) (Oral)   Resp 19   Ht 5' 8.5" (1.74 m)   Wt 136 lb 12.8 oz (62.1 kg)   SpO2 90%   BMI 20.50 kg/m    INTAKE / OUTPUT: I/O last 3 completed shifts: In: 970 [I.V.:970] Out: -   PHYSICAL EXAMINATION: General:  Pleasant, young but chronically  ill appearing male, NAD  Neuro:  Awake, alert, slightly slow to respond at times, oriented, appropriate, MAE  HEENT:  Mm moist, no JVD  Cardiovascular:  s1s2 rrr Lungs:  resps even non labored on RA, diminished bases otherwise clear  Abdomen:  Significant ascites, non tender, +bs  Musculoskeletal:  Warm and dry, no sig edema   LABS:  BMET  Recent Labs Lab 10/18/15 1641 10/18/15 2238 10/19/15 0220  NA 124* 125* 125*  K 5.3* 4.7 4.7  CL 92* 96* 99*  CO2 19* 17* 17*  BUN 59* 55* 55*  CREATININE 3.39* 2.78* 2.75*  GLUCOSE 90 101* 98    Electrolytes  Recent Labs Lab 10/18/15 1641  10/18/15 2238 10/19/15 0220  CALCIUM 8.8* 8.4* 8.2*    CBC  Recent Labs Lab 10/18/15 1641 10/19/15 0220  WBC 23.0* 20.3*  HGB 14.7 13.0  HCT 41.3 37.9*  PLT 260 227    Coag's  Recent Labs Lab 10/18/15 1603  INR 1.58    Sepsis Markers  Recent Labs Lab 10/19/15 0740 10/19/15 1044 10/19/15 1222  LATICACIDVEN 2.8* 2.4* 2.9*    ABG No results for input(s): PHART, PCO2ART, PO2ART in the last 168 hours.  Liver Enzymes  Recent Labs Lab 10/18/15 1641 10/19/15 0220  AST 51* 43*  ALT 39 32  ALKPHOS 329* 276*  BILITOT 3.6* 3.4*  ALBUMIN 2.1* 1.8*    Cardiac Enzymes No results for input(s): TROPONINI, PROBNP in the last 168 hours.  Glucose No results for input(s): GLUCAP in the last 168 hours.  Imaging US Abdomen Limited  Result Date: 10/19/2015 CLINICAL DATA:  Assess ascites. Acute onset of abdominal distention, with current history of cirrhosis. Initial encounter. EXAM: LIMITED ABDOMEN ULTRASOUND FOR ASCITES TECHNIQUE: Limited ultrasound survey for ascites was performed in all four abdominal quadrants. COMPARISON:  Paracentesis images performed 10/04/2015 FINDINGS: Moderate volume ascites is noted within all 4 quadrants of the abdomen. IMPRESSION: Moderate volume ascites noted within all 4 quadrants of the abdomen. Electronically Signed   By: Roanna Raider M.D.   On: 10/19/2015 02:38     STUDIES:  Renal u/s 9/20>>>  CULTURES:   ANTIBIOTICS: Ceftriaxone 9/20>>>  SIGNIFICANT EVENTS:   LINES/TUBES:   ASSESSMENT / PLAN:  Hypotension - mild  P:  Consider midodrine per [facility] recs  Gentle volume as below  Monitor need for vasopressors  Monitor in SDU  No further lactate - trend flat, suspect r/t liver, does not indicate sepsis    Hyponatremia  AKI - suspect some degree prerenal r/t dehydration, diuretics.  Scr already improving slowly.  Still some concern hepatorenal.  P:   Urine Na, urine osm, u/a  Hold diuretics  Continue gentle volume  with NS  Renal needs to see  Check renal u/s    ETOH cirrhosis  Alcoholic hepatitis  P:   GI following  Lactulose  Trend LFT's, coags  Thiamine, folate  Hold off on paracentesis for now   Leukocytosis  P:   Empiric coverage SBP    Hepatic encephalopathy P:   Continue lactulose  F/u ammonia   FAMILY  - Updates:  Pt, mom updated at length at bedside 9/20.    OK for tx to SDU for closer monitoring of hypotension but no role ICU or pressors at this time.   Dirk Dress, NP 10/19/2015  2:29 PM   STAFF NOTE: Cindi Carbon, MD FACP have personally reviewed patient's available data, including medical history, events of note, physical examination and test results as  part of my evaluation. I have discussed with resident/NP and other care providers such as pharmacist, RN and RRT. In addition, I personally evaluated patient and elicited key findings of: awake fully with current sys 90-100, ascites with fluid wave nontender abdo, lungs clear, lower ext no edema, ARF with diff dx ATN / diuretic induced / hypovolemic ARF vs Hepatorenal, I would favor diuretic induced/hypovolemia as crt trend slight better with pos baalnce, typically hepatorenal not turn around well in short period of time also would typically have rapid rise crt, he is perfusing well with current BP, would assess urine na, osm, UA, pcxr in am for volume status, renal us, would NOT do para at this stage which may worsen ARF and deplete intravascularly , would cover empiric sbp with ceftriaxone, will eventually need para or if pain or distress would perform, lactic is not from hypoperfusion, it seems to be from liver failure and NO ROLE TO REPEAT THIS, get cortisol, sdu okay , no role pressors at this stage, will follow, I udpated pt and his mom in room, albumin may be a consideration early  Mcarthur RossettiDaniel J. Tyson AliasFeinstein, MD, FACP Pgr: [phone] [facility] 10/19/2015 2:39 PM   Pager: [phone] or  [phone]

## 2015-10-19 NOTE — Progress Notes (Signed)
CALL PAGER [phone] for any questions or notifications regarding this patient   FMTS Attending Additional Note: Denny LevySara Neal MD  Attending [phone]:[phone]  office [phone]  [facility] and spoke with Dr Piedad Climesarling. I suspect Mr Michael [name] is critically ill and has very poor prognosis. I think he [facility] and the are agreeable but do not cut=rrently have a bed availalbe. They did give us some ideas about additional treatment measures and we are thankful for those. 1. BP is running low. He is mos tlikely intravascularly depleted given his self imposed fluid restriction (in an effort to prevent more ascites). We started gentle hydration last night. After discussion with [facility] we were trying to decide between transfer to step down unit and addition of octreotide and midodrine vs ICU transfer for pressors. With that in mind we consulted [facility]. They declined to take him into ICU but are consulting. They disagreed with albumin. They also wanted us to formally consult nephrology which we have done. There is some disagreement amongst our consultants regarding : 1) use of albumin; 2) paracentesis; 3) appropriate level of care (ICU vs step down. My team and I have discussed extensively and reviewed all of his information. I have also personally discussed all of thse issues with him, his Mother and his fiance ([name])  I think the most reasonable plana t this time is to transfer him to SDU, start octreotide and midodrine and also albumin. Evidence for albumin is not strong but there is low risk. Will have already started empiric antibiotics for potential SBP. We will increase hydration  gently. I suspect he will need paracentesis in next day or so, for comfort if for nothing else. He has had paracentesis before so unclear the diagnostic utility, except for SBP and we are already starting prophylactic therapy. Should his condition worsen, will advance SBP treatment and reassess. 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  I think his prognosis is extremely poor.  Appreciate all consultants input and care.

## 2015-10-19 NOTE — Progress Notes (Signed)
Discussed with Dr. Piedad Climesarling, GI at [facility], who has agreed to accept transfer of patient. Given that [facility] and anticipated transfer would take several days, we discussed management of patient [facility].   Darling recommended Albumin 50g BID IV for intra-vascular repletion. Additionally, she recommended either Midodrine 10 mg TID IV with SQ Octreotide TID vs. Transfer to ICU for Norepinephrine.    Additionally, given his hepatorenal syndrome Piedad ClimesDarling was concerned for SBP as precipitant for ARF despite his unremarkable physical exam. She recommended empiric treatment with a cephalosporin as well as paracentesis today with diagnostics.   Have consulted [facility] for further management in the SDU vs. Transfer to the ICU. For now will plan transfer orders for SDU and begin empiric antibiotics for possible SBP.   Marcy Sirenatherine Wallace, D.O. 10/19/2015, 12:25 PM PGY-2, [facility]

## 2015-10-19 NOTE — Progress Notes (Signed)
Went to see patient to assess for improvement in mental status  S: patient denies chest pain, SOB, abd pain  BP (!) 94/59 (BP Location: Left Arm)   Pulse (!) 116   Temp 98.2 F (36.8 C)   Resp 19   Ht 5' 8.5" (1.74 m)   Wt 136 lb 12.8 oz (62.1 kg)   SpO2 99%   BMI 20.50 kg/m   Exam Gen: NAD, easily aroused CV: tachycardic but improved, rhythm Pulm: good air movement, mild coarse breath sound throughout Abd: distended, non tender  A/P 45 y/o male with alcoholic cirrhosis presented for acute renal failure in setting of dehydration, now with improved somnolence, improving tachycardia and hypotension Tachycardia- continues to be tachycardic, although improving mildly, despite fluid rehydration. Conisder d dimer if tachycardia remains. No SOB, or respiratory symptoms with Wells 1.5.  Lactic acidosis- 2.2<--2.5, will trend. Still no fever and is overall well appearing so true systemic infection less likely Hypotension- resolving with IVF Somnolence- now resolved,  reduce ativan if need it in future given prolonged somnolence   Jermiya Reichl A. Kennon RoundsHaney MD, MS Family Medicine Resident PGY-3 Pager [phone]

## 2015-10-19 NOTE — Progress Notes (Addendum)
FPTS Interim Progress Note  S:CTSP by rapid response nurse for hypersomnolence and tachycardia. Patient is sleepy but arousable, denies abdominal pain, SOB, chest pain or pain in his wrist. There was some concern for increases anxiety at approximately 9 PM, 4 hours ago, at which time he had 2 mg IV ativan. Since then he has been somnolent and more difficult to arouse.  O: BP (!) 74/48   Pulse (!) 120   Temp 97.5 F (36.4 C) (Oral)   Resp 19   Ht 5' 8.5" (1.74 m)   Wt 133 lb (60.3 kg)   SpO2 99%   BMI 19.93 kg/m   Exam: Gen: NAD, arousable to voice and shaking stimulation but falls asleep quickly  Pulm: good air movement but coarse breath sound bilaterally CV: tachycardia, regular rhythm Abd: distended abdomen, non tender MSK: asterixis pesent Neuro: alert to person and time, though clinton was president  A/P: 45 y/o male with alcoholic cirrhosis presented for acute renal failure in setting of dehydration, now with sonolence after ativan administration. He is tachycardic to 120s and hypotensive to 74/48, no evidence of respiratory distress and maintaining good respiration and oxgen saturation Somnolence- likely due to ativan administration, as this is not a typical medication and he has acute renal injury. Cr 2.78 <--3.39 from baseline 0.98. There is concern that his tachycardia with chronic elevated WBC could indicate acute systemic infection however he has had no fevers and his WBC seems to be chronically elevated. His lactate is 2.5 now, however he did not have one on arrival and it is likely that this has trended down from what it would have been on arrival of checked. Will trend lactate to ensure resolution, check ammonia. Pharmacy was called regarding other meds he could be on that could make him more somnolent and they felt while the ativan was given several hours ago it would still be active for up to 8 hours and the buspar which he got very close to the ativan could also contribute.  Will hold ativan and any sedating meds for now. Buspar next due at 10:00 tomorrow AM, will consider holding if mental status not to baseline Tachycardia- has been tachycardic to 110s since arrival, now mildly more so to 120. EKG shows sinus tachycardia, will give 500 ml fluid bolus then continue fluids at 100 cc/hr and follow closely. Less likely MI given no chest or PE given normal oxygen sats and RR. Will consider d dimer if vitals do not improve with fluids. Wells 1.5 for tachycardia Hypotension- level of hypotension unchanged from admission but it had been improvingsince then. Very likely related to the fact that he is sleeping. Could be part of a SIRS picture but less likely due to lack of fever, will follow after fluids administration   Bonney AidAlyssa A Haney, MD 10/19/2015, 1:11 AM PGY-3, [facility] pager [phone]

## 2015-10-19 NOTE — Consult Note (Addendum)
[facility] Consultation Note Requesting Physician:  Dr. Earlene Plater Reason for Consult:  AKI  HPI: The patient is a 45 y.o. year-old with a history of alcohol abuse who was diagnosed 05/2015 with alcoholic cirrhosis 05/2015 after presenting with edema, abdominal distension and ascites.  This was a new diagnosis for him at that time, and he has not consumed alcohol since then.  He was treated at that time with steroids for alcoholic hepatitis, and had paracenteses X 2 during that admission.  Imaging studies showed cirrhosis, portal v collaterals, esophageal varices and ascites.  He had an episode of AKI on CKD during that admission with creatinine that bumped from 0.99 up to 1.58, but improved back to a creatinine around 1. Has had paracentesis X 3 since then (7/29, 8/7, 9/5).   He is admitted on the current occasion with abdominal pain, and a creatinine that had increased from 1 ->3 and was 3.39 on admission. Urine sodium <10, FeNa less than 1.  Urinalysis negative.  He has received fluid boluses X2. Has been ordered to receive albumin 50 gm BID for intravascular repletion (not yet rec'd).  Contact was made with [facility] who has accepted the pt in transfer, but no beds yet available. In addition they recommended treating as if HRS with either midodrine plus octreotide or neosynephrine plus octreotide. He is on empiric Rocephin for the possibility of SBP, and he is to have diagnostic paracentesis.   Per pt and his mother, he has felt poorly for the past week, with nausea and vomiting.  Stopped eating and drinking so he wouldn't vomit. Continued to take his diuretics. Says he "felt cold all the time" but isn't sure if fever.  Says continued to pass urine normally.  Has had no LE edema despite worsening of his ascites since his last paracentesis. Denies use of NSAIDS.   Is also being followed by pulmonary for lung nodules with negative autoimmune studies, AFP and rescanning has been recommended.  Creatinine  summary for reference: 10/19/2015 02:20 AM 2.75 (H) 0.61 - 1.24 mg/dL Final  16/10/9602 54:09 PM 2.78 (H) 0.61 - 1.24 mg/dL Final  81/19/1478 29:56 PM 3.39 (H) 0.61 - 1.24 mg/dL Final  21/30/8657 84:69 AM 0.98 0.61 - 1.24 mg/dL Final  62/95/2841 32:44 AM 0.86 0.61 - 1.24 mg/dL Final  01/31/7251 66:44 AM 1.01 0.61 - 1.24 mg/dL Final  03/47/4259 56:38 AM 1.02 0.61 - 1.24 mg/dL Final  75/64/3329 51:88 AM 1.12 0.61 - 1.24 mg/dL Final  41/66/0630 16:01 AM 1.18 0.61 - 1.24 mg/dL Final  09/32/3557 32:20 AM 1.29 (H) 0.61 - 1.24 mg/dL Final  25/42/7062 37:62 AM 1.58 (H) 0.61 - 1.24 mg/dL Final  83/15/1761 60:73 PM 1.43 (H) 0.61 - 1.24 mg/dL Final  71/06/2692 85:46 AM 0.99 0.61 - 1.24 mg/dL Final     Past Medical History:  Diagnosis Date  . Anxiety   . Ascites   . Cirrhosis of liver [facility])      Past Surgical History:  Procedure Laterality Date  . NO PAST SURGERIES     SURGERY AS INFANT PYLORIC STENOSIS  . OPEN REDUCTION INTERNAL FIXATION (ORIF) FOOT LISFRANC FRACTURE Right 08/06/2014   Procedure: RIGHT FOOT FRACTURE AND LISFRANC LIGAMENT INJURY FIXATION AND SUTURE REMOVAL LEFT WRIST INCISION;  Surgeon: Nadara Mustard, MD;  Location: [facility] OR;  Service: Orthopedics;  Laterality: Right;  . ORIF WRIST FRACTURE Left 07/20/2014   Procedure: OPEN REDUCTION INTERNAL FIXATION (ORIF) WRIST FRACTURE;  Surgeon: Cammy Copa, MD;  Location: [facility] OR;  Service: Orthopedics;  Laterality: Left;  . PARACENTESIS    . SMALL INTESTINE SURGERY     as an infant    Family History  Problem Relation Age of Onset  . Prostate cancer Father   . Diabetes Mellitus II Paternal Grandfather    Social History:  reports that he quit smoking about 4 months ago. His smoking use included Cigarettes. He has a 18.00 pack-year smoking history. He has never used smokeless tobacco. He reports that he does not drink alcohol or use drugs.  Allergies: No Known Allergies  Home medications: Prior to Admission medications    Medication Sig Start Date End Date Taking? Authorizing Provider  busPIRone (BUSPAR) 5 MG tablet Take 1 tablet (5 mg total) by mouth 2 (two) times daily. 08/18/15  Yes Henrietta HooverLinda C Bernhardt, NP  folic acid (FOLVITE) 1 MG tablet Take 1 tablet (1 mg total) by mouth daily. 07/12/15  Yes Massie MaroonLachina M Hollis, FNP  furosemide (LASIX) 40 MG tablet Take 1 tablet (40 mg total) by mouth daily. Patient taking differently: Take 120 mg by mouth 2 (two) times daily.  07/12/15  Yes Massie MaroonLachina M Hollis, FNP  lactulose (CHRONULAC) 10 GM/15ML solution TAKE 15 MLS BY MOUTH TWICE DAILY 09/09/15  Yes Henrietta HooverLinda C Bernhardt, NP  omeprazole (PRILOSEC) 20 MG capsule Take 20 mg by mouth 2 (two) times daily before a meal.   Yes Historical Provider, MD  pantoprazole (PROTONIX) 40 MG tablet Take 1 tablet (40 mg total) by mouth daily. 07/12/15  Yes Massie MaroonLachina M Hollis, FNP  predniSONE (DELTASONE) 10 MG tablet Take 10 mg by mouth daily with breakfast. Pt has been taking for two weeks, 7 days left.  Goes back to dr on 9-27   Yes Historical Provider, MD  spironolactone (ALDACTONE) 100 MG tablet Take 200 mg by mouth daily.   Yes Historical Provider, MD  thiamine 100 MG tablet Take 1 tablet (100 mg total) by mouth daily. 07/12/15  Yes Massie MaroonLachina M Hollis, FNP  nystatin (MYCOSTATIN) 100000 UNIT/ML suspension Take 5 mLs (500,000 Units total) by mouth 4 (four) times daily. Patient not taking: Reported on 10/18/2015 09/02/15   Kalman ShanMurali Ramaswamy, MD  spironolactone (ALDACTONE) 50 MG tablet Take 1 tablet (50 mg total) by mouth 2 (two) times daily. Patient not taking: Reported on 10/18/2015 07/12/15   Massie MaroonLachina M Hollis, FNP  Tetrahydrozoline HCl (VISINE OP) Place 2 drops into both eyes as needed (for dry eyes).    Historical Provider, MD    Inpatient medications: . albumin human  50 g Intravenous BID  . busPIRone  7.5 mg Oral BID  . cefTRIAXone (ROCEPHIN)  IV  1 g Intravenous Q24H  . enoxaparin (LOVENOX) injection  30 mg Subcutaneous Q24H  . folic acid  1 mg Oral  Daily  . lactulose  20 g Oral BID  . multivitamin with minerals  1 tablet Oral Daily  . nystatin  5 mL Oral QID  . pantoprazole  40 mg Oral Daily  . predniSONE  10 mg Oral Q breakfast  . thiamine  100 mg Oral Daily    Review of Systems See HPI.  Most of ROS obtained from mother as pt intermittently somnolent during the interview     Physical Exam:  Blood pressure (!) 92/54, pulse 68, temperature 98.1 F (36.7 C), temperature source Oral, resp. rate 19, height 5' 8.5" (1.74 m), weight 62.1 kg (136 lb 12.8 oz), SpO2 90 %.  Gen: Bearded WM, scleral icterus Somnolent, and at times unable to follow the  conversation Mother in with him who provides history VS as noted Excoriations on lower legs from scratching No JVD Lungs clear Tachy 100 at time of my exam.  S1S2 No S3 or murmur. Protuberant, distended, firm abdomen.with some focal left sided tenderness. + BS. I can't feel liver edge. No edema whatsoever of the lower extremities. Mild asterixus  Labs:   Recent Labs Lab 10/18/15 1641 10/18/15 2238 10/19/15 0220  NA 124* 125* 125*  K 5.3* 4.7 4.7  CL 92* 96* 99*  CO2 19* 17* 17*  GLUCOSE 90 101* 98  BUN 59* 55* 55*  CREATININE 3.39* 2.78* 2.75*  CALCIUM 8.8* 8.4* 8.2*     Recent Labs Lab 10/18/15 1641 10/19/15 0220  AST 51* 43*  ALT 39 32  ALKPHOS 329* 276*  BILITOT 3.6* 3.4*  PROT 5.8* 4.8*  ALBUMIN 2.1* 1.8*    Recent Labs Lab 10/18/15 1641  LIPASE 113*    Recent Labs Lab 10/19/15 0220  AMMONIA 70*     Recent Labs Lab 10/18/15 1641 10/19/15 0220  WBC 23.0* 20.3*  HGB 14.7 13.0  HCT 41.3 37.9*  MCV 95.6 95.9  PLT 260 227   Lactic Acid, Venous    Component Value Date/Time   LATICACIDVEN 2.9 (HH) 10/19/2015 1222   Results for NAREN, BENALLY (MRN [id number]) as of 10/19/2015 15:27  10/18/2015 18:33  Appearance CLEAR  Bilirubin Urine NEGATIVE  Color, Urine AMBER (A)  Glucose NEGATIVE  Hgb urine dipstick NEGATIVE  Ketones, ur NEGATIVE   Leukocytes, UA NEGATIVE  Nitrite NEGATIVE  pH 5.0  Protein NEGATIVE  Specific Gravity, Urine 1.013   Results for WILVER, TIGNOR (MRN [id number]) as of 10/19/2015 15:27   06/18/2015 06:11  Osmolality, Urine 392  Sodium, Urine <10   US Abdomen Limited  Result Date: 10/19/2015 CLINICAL DATA:  Assess ascites. Acute onset of abdominal distention, with current history of cirrhosis. Initial encounter. EXAM: LIMITED ABDOMEN ULTRASOUND FOR ASCITES TECHNIQUE: Limited ultrasound survey for ascites was performed in all four abdominal quadrants. COMPARISON:  Paracentesis images performed 10/04/2015 FINDINGS: Moderate volume ascites is noted within all 4 quadrants of the abdomen. IMPRESSION: Moderate volume ascites noted within all 4 quadrants of the abdomen. Electronically Signed   By: Roanna Raider M.D.   On: 10/19/2015 02:38    Background: 45 y.o. year-old with a history of alcohol abuse who was diagnosed 05/2015 with alcoholic cirrhosis 05/2015 after presenting with edema, abdominal distension and ascites (new diagnosis for him at that time, and he has not consumed alcohol since then).  He was treated at that time with steroids for alcoholic hepatitis, and had paracenteses X 2 during that admission.  Imaging studies showed cirrhosis, portal v collaterals, esophageal varices and ascites.  He had an episode of AKI on CKD during that admission with creatinine that bumped from 0.99 up to 1.58, but improved back to a creatinine around 1. Has had paracentesis X 3 since then (7/29, 8/7, 9/5). He is admitted on the current occasion with abdominal pain, and a creatinine that had increased from 1 ->3 and was 3.39 on admission and we are asked to see.  Assessment  1. Acute kidney injury - Urine Na and FeNa both low, c/w pre-renal etiology.  UA entirely benign. Difficult to know if dealing with intravascular volume depletion (illness for week PTA with nausea, vomiting, poor po, continued consumption of diuretics, no  edema whatsoever) vs SBP/sepsis (worsened abd pain, and again the 1 week illness GI illness)  vs hepatorenal syndrome (dx  of exclusion).  Has had some gentle volume expansion with crystalloid, has not yet rec'd  albumin.  Renal function has improved somewhat with the vol rec'd and holding diuretics. 1. Agree with albumin, prn fluid boluses  2. Would treat presumptively as if HRS despite this being dx of exclusion. Since not going to ICU, start midodrine 10 mg TID and and octreotide. The infusion of octreotide may be superior to the TID SQ injections and CAN be given outside the ICU setting.   3. I am actually inclined to think not HRS as there has been some improvement in renal fx since admission... 4. Trend labs 5. Strict intake and output     2. Alcoholic cirrhosis with h/o alcoholic hepatitis  1. Holding diuretics 2. GI following 3. Hopeful for transfer to [facility] (no EtOH since 05/2015) - earlier notes indicate 6 month period of abstinence needed for transplant eligibility 4. Lactulose for elevated ammonia and somnolence 5. Remains on steroids  3. Refractory ascites - GI notes that he continues to develop sig ascites (despite diuretics, and in this case with little to eat or drink over the past week and NO LE edema 1. Sodium restrict 2. Holding diuretics d/t AKI 3. Hold off on LVP in face of AKI  4. ? SBP - abdominal pain, N/V preceded admission and had 1 week illness PTA 1. Diagnostic paracentesis  was recommended by [facility] 2. Empiric ceftriaxone has been prescribed   5. Hypotension/tachycardia - with lactic acidosis 1. Currently on empiric ceftriaxone for poss of SBP 2. Has rec'd volume with some improvement  Thanks for the consult and will follow with you.    Camille Bal,  MD [facility] [phone] pager 10/19/2015, 2:02 PM

## 2015-10-19 NOTE — Progress Notes (Signed)
CRITICAL VALUE ALERT  Critical value received:  Lactic acid 2.5  Date of notification:  10/19/15   Time of notification:  0048  Critical value read back:Yes.    Nurse who received alert:  Avie ArenasNikki W   MD notified (1st page):  Walden FieldManey, MD/Leslie RR RN  Time of first page:  [phone]  MD notified (2nd page):  Time of second page:  Responding MD:  Rapid response and Maney, md  Time MD responded:  [phone]

## 2015-10-20 LAB — COMPREHENSIVE METABOLIC PANEL
ALK PHOS: 245 U/L — AB (ref 38–126)
ALT: 29 U/L (ref 17–63)
ANION GAP: 10 (ref 5–15)
AST: 40 U/L (ref 15–41)
Albumin: 2.3 g/dL — ABNORMAL LOW (ref 3.5–5.0)
BILIRUBIN TOTAL: 2.7 mg/dL — AB (ref 0.3–1.2)
BUN: 54 mg/dL — ABNORMAL HIGH (ref 6–20)
CALCIUM: 8.3 mg/dL — AB (ref 8.9–10.3)
CO2: 18 mmol/L — ABNORMAL LOW (ref 22–32)
CREATININE: 2.13 mg/dL — AB (ref 0.61–1.24)
Chloride: 99 mmol/L — ABNORMAL LOW (ref 101–111)
GFR calc non Af Amer: 35 mL/min — ABNORMAL LOW (ref >60)
GFR, EST AFRICAN AMERICAN: 41 mL/min — AB (ref >60)
GLUCOSE: 110 mg/dL — AB (ref 65–99)
Potassium: 5 mmol/L (ref 3.5–5.1)
Sodium: 127 mmol/L — ABNORMAL LOW (ref 135–145)
TOTAL PROTEIN: 5.1 g/dL — AB (ref 6.5–8.1)

## 2015-10-20 LAB — PROTIME-INR
INR: 1.82
Prothrombin Time: 21.3 seconds — ABNORMAL HIGH (ref 11.4–15.2)

## 2015-10-20 LAB — LACTIC ACID, PLASMA: LACTIC ACID, VENOUS: 2.3 mmol/L — AB (ref 0.5–1.9)

## 2015-10-20 LAB — MRSA PCR SCREENING: MRSA by PCR: POSITIVE — AB

## 2015-10-20 LAB — OSMOLALITY, URINE: OSMOLALITY UR: 455 mosm/kg (ref 300–900)

## 2015-10-20 LAB — LACTATE DEHYDROGENASE: LDH: 176 U/L (ref 98–192)

## 2015-10-20 MED ORDER — SODIUM CHLORIDE 0.9% FLUSH: 10.0000 mL | INTRAVENOUS | Status: DC | PRN

## 2015-10-20 MED ORDER — ENOXAPARIN SODIUM 40 MG/0.4ML ~~LOC~~ SOLN
40.0000 mg | SUBCUTANEOUS | Status: DC
Start: 2015-10-20 — End: 2015-10-22
  Administered 2015-10-20 – 2015-10-21 (×2): 40 mg via SUBCUTANEOUS
  Filled 2015-10-20 (×2): qty 0.4

## 2015-10-20 MED ORDER — MUPIROCIN 2 % EX OINT
1.0000 "application " | TOPICAL_OINTMENT | Freq: Two times a day (BID) | CUTANEOUS | Status: DC
  Administered 2015-10-20 – 2015-10-22 (×6): 1 via NASAL
  Filled 2015-10-20: qty 22

## 2015-10-20 MED ORDER — SODIUM CHLORIDE 0.45 % IV SOLN
INTRAVENOUS | Status: DC
  Administered 2015-10-20 – 2015-10-22 (×2): via INTRAVENOUS
  Filled 2015-10-20 (×7): qty 1000

## 2015-10-20 MED ORDER — MIDODRINE HCL 5 MG PO TABS
12.5000 mg | ORAL_TABLET | Freq: Three times a day (TID) | ORAL | Status: DC
  Administered 2015-10-20 – 2015-10-22 (×7): 12.5 mg via ORAL
  Filled 2015-10-20 (×7): qty 3

## 2015-10-20 MED ORDER — CHLORHEXIDINE GLUCONATE CLOTH 2 % EX PADS
6.0000 | MEDICATED_PAD | Freq: Every day | CUTANEOUS | Status: DC
  Administered 2015-10-20 – 2015-10-21 (×2): 6 via TOPICAL

## 2015-10-20 NOTE — Progress Notes (Addendum)
[facility] Progress Note  Michael [name] 45 y.o. 1970/07/10  CC:  Ascites, acute kidney injury   Subjective: Patient complaining of weakness and abdominal distention . Afebrile. Denied any vomiting.  ROS : Complaining of abdominal distention. Denied any vomiting. Able to tolerate diet.   Objective: Vital signs in last 24 hours: Vitals:   10/20/15 0333 10/20/15 0754  BP: 117/67 119/81  Pulse: (!) 108 (!) 115  Resp: (!) 21 (!) 21  Temp: 98.3 F (36.8 C) 98.4 F (36.9 C)    Physical Exam:  General:  Alert, cooperative, no distress, appears stated age  Head:  Normocephalic, without obvious abnormality, atraumatic  Eyes:  , EOM's intact, Scleral icterus noted   Lungs:   Fine basilar crackles   Heart:  Regular rate and rhythm, S1, S2 normal  Abdomen:   Distended with ascites , mild left upper quadrant discomfort , BS present   Extremities: Extremities normal, atraumatic, no  edema       Lab Results:  Recent Labs  10/19/15 0220 10/20/15 0135  NA 125* 127*  K 4.7 5.0  CL 99* 99*  CO2 17* 18*  GLUCOSE 98 110*  BUN 55* 54*  CREATININE 2.75* 2.13*  CALCIUM 8.2* 8.3*    Recent Labs  10/19/15 0220 10/20/15 0135  AST 43* 40  ALT 32 29  ALKPHOS 276* 245*  BILITOT 3.4* 2.7*  PROT 4.8* 5.1*  ALBUMIN 1.8* 2.3*    Recent Labs  10/18/15 1641 10/19/15 0220  WBC 23.0* 20.3*  HGB 14.7 13.0  HCT 41.3 37.9*  MCV 95.6 95.9  PLT 260 227    Recent Labs  10/18/15 1603 10/20/15 0135  LABPROT 19.0* 21.3*  INR 1.58 1.82      Assessment/Plan: - Acute kidney injury in setting of diuretics use for ascites.Pre-renal. Less likely Hepatorenal . Improving. Hold diuretics for now - Alcoholic cirrhosis. Decompensated. - Refractory ascites - Leukocytosis - Abnormal LFTs. From  decompensated liver disease - History of hepatic encephalopathy - Hypotension  Recommendations -------------------------- - Appreciate nephrology input. - Recommend avoiding large  volume paracentesis. Okay to have diagnostic paracentesis. - Recommend continuation of octreotide and midodrine along with albumin for volume expansion for now, patient may benefit from Levophed drip given his hypotension. Consider D/C albumin and Octreotide tomorrow  - Based on chart review it looks [facility] but awaiting bed [facility] - Patient's prognosis remains poor - GI will follow   Kathi DerParag Nihira Puello MD, FACP 10/20/2015, 9:00 AM  Pager [phone]  If no answer or after 5 PM call [phone]

## 2015-10-20 NOTE — Care Management Note (Signed)
Case Management Note  Patient Details  Name: Michael [name] MRN: [id number] Date of Birth: [birth year]  Subjective/Objective:   CM and financial counselor met with pt, girlfriend, and mother @ bedside.  Pt lives alone but girlfriend lives across the street from him.  Pt has been followed by FNP Smith Robert @ [facility] and by Dr Oletta Lamas with [facility].  Pt obtains his medications from [facility] and [facility].                Action/Plan: Pt has been accepted by physician @ [facility] and is waiting for bed @ that facility.         Expected Discharge Plan:  Acute to Acute Transfer  IDischarge planning Services  CM Consult  Status of Service:  In process, will continue to follow  Girard Cooter, RN 10/20/2015, 1:54 PM

## 2015-10-20 NOTE — Progress Notes (Signed)
CRITICAL VALUE ALERT  Critical value received:  Lactic acid 2.3  Date of notification:  10/20/15  Time of notification:  0715  Critical value read back: yes  Nurse who received alert:  Kizzie BaneAmy Denarius Sesler, RN  MD notified (1st page):  FMTS  Time of first page:  0720  Responding MD:  FMTS  Time MD responded:  [phone]

## 2015-10-20 NOTE — Progress Notes (Signed)
Peripherally Inserted Central Catheter/Midline Placement  The IV Nurse has discussed with the patient and/or persons authorized to consent for the patient, the purpose of this procedure and the potential benefits and risks involved with this procedure.  The benefits include less needle sticks, lab draws from the catheter, and the patient may be discharged home with the catheter. Risks include, but not limited to, infection, bleeding, blood clot (thrombus formation), and puncture of an artery; nerve damage and irregular heartbeat and possibility to perform a PICC exchange if needed/ordered by physician.  Alternatives to this procedure were also discussed.  Bard Power PICC patient education guide, fact sheet on infection prevention and patient information card has been provided to patient /or left at bedside.    PICC/Midline Placement Documentation        Michael [name], Nuel Dejaynes M 10/20/2015, 3:46 PM

## 2015-10-20 NOTE — Progress Notes (Addendum)
[facility] Rounding Note  Subjective/Interval History:   Moved to SDU Current mgmt currently is midodrine/octreotide/albumin, NS Not much UOP, but creatinine has fallen and BP is improving.  Objective Vital signs in last 24 hours: Vitals:   10/19/15 2120 10/19/15 2356 10/20/15 0333 10/20/15 0754  BP: 118/83 103/72 117/67 119/81  Pulse: (!) 116 (!) 117 (!) 108 (!) 115  Resp: 20 15 (!) 21 (!) 21  Temp: 98.1 F (36.7 C) 98.1 F (36.7 C) 98.3 F (36.8 C) 98.4 F (36.9 C)  TempSrc: Oral Oral Oral Oral  SpO2: 99% 99% 98% 97%  Weight: 64.2 kg (141 lb 9.6 oz)     Height: 5\' 8"  (1.727 m)      Weight change: 3.901 kg (8 lb 9.6 oz)  Intake/Output Summary (Last 24 hours) at 10/20/15 0848 Last data filed at 10/20/15 0700  Gross per 24 hour  Intake          2954.16 ml  Output              225 ml  Net          2729.16 ml   Physical Exam:  Blood pressure 119/81, pulse (!) 115, temperature 98.4 F (36.9 C), temperature source Oral, resp. rate (!) 21, height 5\' 8"  (1.727 m), weight 64.2 kg (141 lb 9.6 oz), SpO2 97 %.   Bearded WM, scleral icterus VS as noted with improvement in BP Excoriations on lower legs from scratching No JVD Lungs clear Tachy 115 S1S2 No S3 or murmur. Protuberant, distended, tight  abdomen. No LE edema  Labs: Basic Metabolic Panel:  Recent Labs Lab 10/18/15 1641 10/18/15 2238 10/19/15 0220 10/20/15 0135  NA 124* 125* 125* 127*  K 5.3* 4.7 4.7 5.0  CL 92* 96* 99* 99*  CO2 19* 17* 17* 18*  GLUCOSE 90 101* 98 110*  BUN 59* 55* 55* 54*  CREATININE 3.39* 2.78* 2.75* 2.13*  CALCIUM 8.8* 8.4* 8.2* 8.3*     Recent Labs Lab 10/18/15 1641 10/19/15 0220 10/20/15 0135  AST 51* 43* 40  ALT 39 32 29  ALKPHOS 329* 276* 245*  BILITOT 3.6* 3.4* 2.7*  PROT 5.8* 4.8* 5.1*  ALBUMIN 2.1* 1.8* 2.3*    Recent Labs Lab 10/18/15 1641  LIPASE 113*    Recent Labs Lab 10/19/15 0220  AMMONIA 70*   CBC:  Recent Labs Lab 10/18/15 1641 10/19/15 0220  WBC  23.0* 20.3*  HGB 14.7 13.0  HCT 41.3 37.9*  MCV 95.6 95.9  PLT 260 227   Studies/Results: Dg Chest 2 View  Result Date: 10/19/2015 CLINICAL DATA:  Follow-up hepatorenal syndrome EXAM: CHEST  2 VIEW COMPARISON:  08/27/2015 FINDINGS: Trace bilateral pleural effusion. Low volume chest with pulmonary vascular congestion. Small nodular foci seen previously are less conspicuous. Normal heart size and mediastinal contours. IMPRESSION: 1. Decreasing conspicuity of bilateral pulmonary nodules seen 08/27/2015. 2. Trace pleural effusions. Electronically Signed   By: Marnee Spring M.D.   On: 10/19/2015 14:53   US Renal  Result Date: 10/19/2015 CLINICAL DATA:  Hepatorenal syndrome. EXAM: RENAL / URINARY TRACT ULTRASOUND COMPLETE COMPARISON:  CT of 08/27/2015 FINDINGS: Right Kidney: Length: 9.7 cm. Echogenicity within normal limits. No mass or hydronephrosis visualized. Left Kidney: Length: 9.2 cm. Echogenicity within normal limits. No mass or hydronephrosis visualized. Bladder: Decompressed urinary bladder. Note is made of large volume ascites. IMPRESSION: No hydronephrosis or explanation for elevated creatinine. Ascites. Electronically Signed   By: Jeronimo Greaves M.D.   On: 10/19/2015 18:26  [place] Abdomen Limited  Result Date: 10/19/2015 CLINICAL DATA:  Assess ascites. Acute onset of abdominal distention, with current history of cirrhosis. Initial encounter. EXAM: LIMITED ABDOMEN ULTRASOUND FOR ASCITES TECHNIQUE: Limited ultrasound survey for ascites was performed in all four abdominal quadrants. COMPARISON:  Paracentesis images performed 10/04/2015 FINDINGS: Moderate volume ascites is noted within all 4 quadrants of the abdomen. IMPRESSION: Moderate volume ascites noted within all 4 quadrants of the abdomen. Electronically Signed   By: Roanna RaiderJeffery  Chang M.D.   On: 10/19/2015 02:38   Medications: . sodium chloride 100 mL/hr at 10/20/15 0511  . octreotide  (SANDOSTATIN)    IV infusion 50 mcg/hr (10/20/15 0437)    . albumin human  50 g Intravenous BID  . busPIRone  7.5 mg Oral BID  . cefTRIAXone (ROCEPHIN)  IV  1 g Intravenous Q24H  . Chlorhexidine Gluconate Cloth  6 each Topical Q0600  . enoxaparin (LOVENOX) injection  40 mg Subcutaneous Q24H  . folic acid  1 mg Oral Daily  . lactulose  20 g Oral BID  . midodrine  10 mg Oral TID WC  . multivitamin with minerals  1 tablet Oral Daily  . mupirocin ointment  1 application Nasal BID  . nystatin  5 mL Oral QID  . pantoprazole  40 mg Oral Daily  . predniSONE  10 mg Oral Q breakfast  . thiamine  100 mg Oral Daily    Background: 45 y.o. year-old with a history of alcohol abuse who was diagnosed 05/2015 with alcoholic cirrhosis 05/2015 after presenting with edema, abdominal distension and ascites (new diagnosis for him at that time, and he has not consumed alcohol since then).  He was treated at that time with steroids for alcoholic hepatitis, and had paracenteses X 2 during that admission.  Imaging studies showed cirrhosis, portal v collaterals, esophageal varices and ascites.  He had an episode of AKI on CKD during that admission with creatinine that bumped from 0.99 up to 1.58, but improved back to a creatinine around 1. Has had paracentesis X 3 since then (7/29, 8/7, 9/5). He is admitted on the current occasion with abdominal pain, 1 week of n/v/poor por intake, and a creatinine that increased from 1 ->3--> 3.39 on admission and we were asked to see.  Assessment/Recommendations  1. AKI - steady improvement in renal function since admission with volume expansion speaks against HRS (though I think he was correctly treated for the possibility of HRS until renal function declared itself). I will defer to GI re the time course for continuation of octreotide, but would definitely continue the midodrine for BP.  (He is still at risk for HRS in the future).  I note that LVP has been ordered. Would hold off on LVP in the middle of this AKI episode, but diagnostic  paracentesis would be OK. 2. Hyponatremia - improving. Chronic. Related to his cirrhosis. 3. Alcoholic cirrhosis with h/o alcoholic hepatitis - per GI 4. Refractory ascites - my thoughts on LV paracentesis noted above.  5. ? SBP - as above. On empiric rocephin. 6. MRSA PCR +  Camille Balynthia Liviana Mills, MD [facility] [phone] pager 10/20/2015, 8:48 AM

## 2015-10-20 NOTE — Progress Notes (Signed)
Family Medicine Teaching Service Daily Progress Note Intern Pager: [phone]  Patient name: Michael [name] Medical record number: [id number] Date of birth: 1970-03-09 Age: 45 y.o. Gender: male  Primary Care Provider: Massie MaroonHollis,Lachina M, FNP Consultants: GI, Renal, CCM Code Status: FULL  Pt Overview and Major Events to Date:  [facility] transferred to step down unit for octreotide and midodrine   Assessment and Plan:  Michael [name] is a 45 y.o. male presenting with acute kidney failure. PMH is significant for Cirrhosis diagnosed in May 2017 and anxiety.   Acute Renal Failure Noted on outpatient labs yesterday via [facility] that creatinine had increased from 1 to 3, was instructed to come to ED for further evaluation. Creatinine in ED 3.3. Patient reports taking high dose lasix and spironolactone at home to aid in preventing ascites. Has also been trying to restrict his fluid intake in the week prior to admission to try to prevent fluid collection. Initial suspicion for pre-renal physiology in the setting of diuretic use and PO restriction. Given his advanced liver disease, hepatorenal syndrome should be considered although it is a diagnosis of exclusion.  -vital signs per step downunit  -nephrology following, appreciate their recommendations -continue gentle IV fluids NS 17100mL/hr, bolus as needed -trend BMP, creatinine trending down from 2.75 to 2.13 today, improvement is encouraging -strict I/Os  -concern remains high for hepatorenal syndrome, patient is receiving IV albumin for intravascular repletion -receiving Midodrine 10mg  TID and Octreotide infusion in step down unit for pressure support  Alcoholic Cirrhosis Was diagnosed in May. MELD score of 33 with 19.6% estimated 3 month mortality. Last drink May 13th 2017. Last paracentesis on 9/9 where 3L removed, no signs of SBP. Follows closely with [facility] for labs. Hepatitis C and HIV negative. [facility] given  severity of cirrhosis in short time span. Over past 2.5 weeks has been increase in fatigue. Has lost appetite past 5 days. Patient is mentating appropriately. CT abdomen from July 2017 showed cirrhosis with portal venous HTN, portal venous collaterals, esophageal varices and ascites. It was without hepatic lesions to suggest HCC or metastasis. Although patient has leukocytosis (appears to be chronic for several months), his abdomen is not remarkably tender and he is afebrile so low suspicion for SBP but would recommend paracentesis for further evaluation.  -[facility] following, appreciate recommendations -CCM following, appreciate recommendations -abdominal ultrasound demonstrated moderate ascites, would likely benefit from paracentesis with upper limit of 3L removal so as not to worsen renal function, if not for just symptomatic relief -will plan for paracentesis today, max 3L removal -patient on CTX for SBP prophylaxis -continue Lactulose 20mg  BID with goal of 2-3 soft bowel movements a day, follow ammonia -PT consult for weakness, they are following, have recommended home health PT -continue home prednisone taper (10mg  daily) -continue home vitamins, thiamine and folate -continue home protonix -continue CIWA scores but discontinued Ativan, scoring for anxiety and tremor -holding home Lasix and Spironolactone at this time -trend LFT's, coag studies  Lactic acidosis Elevated to 2.5 overnight, trended to 2.2 and 2.8 this morning. Patient without signs of infection, likely elevated due to disease process/inflammation -no longer trending, will remain high in setting of cirrhosis  Hyponatremia: Na 124 at admission. Likely related to patient's cirrhosis. Appears to be chronically hyponatremic on previous labs. Patient has no neuro deficits on exam.  -chronically low in setting of cirrhosis -continue to monitor mental status -follow on BMET, Na 127 today -limiting Sodium in fluids as it  will likely  just be lost to ascites  Pulmonary Nodules: Seen on CT chest from July 2017. Findings of numerous pulmonary nodules which could be suggestive of metastatic disease. Pulmonology saw patient in clinic. Recommended repeat CT in 2 months. AFP negative and autoimmune workup unremarkable.  -repeat chest CT outpatient as scheduled  Wrist Abscess Has been there for "a while", WBC chronically elevated, is 22 in ED today, patient has also been on steroids per GI for several months. Unlikely to be due to this wrist abscess but will treat -monitor for now, on antibiotics for prophylaxis of SBP  Thrush On chronic steroids, currently with thrush symptoms. HIV negative. Had been prescribed Nystatin outpatient but never filled Rx.  - nystatin oral solution four times daily  Anxiety Patient reports multiple stressors at home including divorce, death of a child, financial troubles on top of his new medical issues -increased home buspar from 5mg  BID to 7.5 BID -consult CSW and CM   FEN/GI: regular diet Prophylaxis: lovenox  Disposition: continue management in step down unit, possible transfer to [facility] once bed becomes available  Subjective:  [name] is feeling more awake today, still feels the same regarding fatigue and abdominal discomfort. Denies abdominal pain. Tolerating diet okay, no nausea or vomiting. No fevers or chills. Is in high spirits regarding possible transfer to [facility]. Fiance and mother at bedside.  Objective: Temp:  [98.1 F (36.7 C)-98.4 F (36.9 C)] 98.4 F (36.9 C) (09/21 0754) Pulse Rate:  [68-117] 115 (09/21 0754) Resp:  [15-21] 21 (09/21 0754) BP: (92-119)/(54-83) 119/81 (09/21 0754) SpO2:  [90 %-99 %] 97 % (09/21 0754) Weight:  [141 lb 9.6 oz (64.2 kg)] 141 lb 9.6 oz (64.2 kg) (09/20 2120) Physical Exam: General: chronically ill appearing, laying in bed, alert and oriented, no acute distress Cardiovascular: RRR no murmurs rubs or gallops Respiratory: CTA bilaterally with  normal work of breathing Abdomen: distended, faint bowel sounds, non-tender, + fluid wave Extremities: no edema or cyanosis Skin: abscess of right wrist with erythema and scabbing, bruises notes over forearms and legs      Laboratory:  Recent Labs Lab 10/18/15 1641 10/19/15 0220  WBC 23.0* 20.3*  HGB 14.7 13.0  HCT 41.3 37.9*  PLT 260 227    Recent Labs Lab 10/18/15 1641 10/18/15 2238 10/19/15 0220 10/20/15 0135  NA 124* 125* 125* 127*  K 5.3* 4.7 4.7 5.0  CL 92* 96* 99* 99*  CO2 19* 17* 17* 18*  BUN 59* 55* 55* 54*  CREATININE 3.39* 2.78* 2.75* 2.13*  CALCIUM 8.8* 8.4* 8.2* 8.3*  PROT 5.8*  --  4.8* 5.1*  BILITOT 3.6*  --  3.4* 2.7*  ALKPHOS 329*  --  276* 245*  ALT 39  --  32 29  AST 51*  --  43* 40  GLUCOSE 90 101* 98 110*   Ammonia: 70 Lactic Acid: 2.5 > 2.2 > 2.8  Imaging/Diagnostic Tests: Dg Chest 2 View  Result Date: 10/19/2015 CLINICAL DATA:  Follow-up hepatorenal syndrome EXAM: CHEST  2 VIEW COMPARISON:  08/27/2015 FINDINGS: Trace bilateral pleural effusion. Low volume chest with pulmonary vascular congestion. Small nodular foci seen previously are less conspicuous. Normal heart size and mediastinal contours. IMPRESSION: 1. Decreasing conspicuity of bilateral pulmonary nodules seen 08/27/2015. 2. Trace pleural effusions. Electronically Signed   By: Marnee Spring M.D.   On: 10/19/2015 14:53   US Renal  Result Date: 10/19/2015 CLINICAL DATA:  Hepatorenal syndrome. EXAM: RENAL / URINARY TRACT ULTRASOUND COMPLETE COMPARISON:  CT of 08/27/2015 FINDINGS: Right Kidney: Length: 9.7 cm. Echogenicity within normal limits. No mass or hydronephrosis visualized. Left Kidney: Length: 9.2 cm. Echogenicity within normal limits. No mass or hydronephrosis visualized. Bladder: Decompressed urinary bladder. Note is made of large volume ascites. IMPRESSION: No hydronephrosis or explanation for elevated creatinine. Ascites. Electronically Signed   By: Jeronimo Greaves M.D.   On:  10/19/2015 18:26   US Abdomen Limited  Result Date: 10/19/2015 CLINICAL DATA:  Assess ascites. Acute onset of abdominal distention, with current history of cirrhosis. Initial encounter. EXAM: LIMITED ABDOMEN ULTRASOUND FOR ASCITES TECHNIQUE: Limited ultrasound survey for ascites was performed in all four abdominal quadrants. COMPARISON:  Paracentesis images performed 10/04/2015 FINDINGS: Moderate volume ascites is noted within all 4 quadrants of the abdomen. IMPRESSION: Moderate volume ascites noted within all 4 quadrants of the abdomen. Electronically Signed   By: Roanna Raider M.D.   On: 10/19/2015 02:38    Tillman Sers, DO 10/20/2015, 9:17 AM PGY-1, [place] [facility] FPTS Intern pager: [phone], text pages welcome

## 2015-10-20 NOTE — Discharge Summary (Signed)
Family Medicine Teaching [facility] Discharge Summary  Patient name: Para MarchWilliam Vasek Medical record number: [id number] Date of birth: [birth year] Age: 45 y.o. Gender: male Date of Admission: 10/18/2015  Date of Discharge: 10/22/15  Admitting Physician: Nestor RampSara L Neal, MD  Primary Care Provider: Massie MaroonHollis,Lachina M, FNP Consultants: GI, Renal, CCM  Indication for Hospitalization: acute renal failure  Discharge Diagnoses/Problem List:  Alcoholic cirrhosis Acute renal failure  [facility] and specialty hepatology care  Discharge Condition: guarded  Discharge Exam: General: chronically ill appearing, sitting up in bed, NAD Cardiovascular: RRR, no murmurs appreciated Respiratory: CTA bilaterally with normal work of breathing Abdomen: distended, non-tender, +BS Skin: abscess of right wrist with erythema and scabbing, bruises notes over forearms and legs    Brief Hospital Course:   [facility] having outpatient labs done at his GI doctor's office that revealed his creatinine had increased from 1 to 3. He was told to come in to be evaluated further. His PMH is significant for alcoholic cirrhosis diagnosed in May 2017 and anxiety. He has not had alcohol since Jun 11, 2015, the day of diagnosis.  Alcoholic Cirrhosis Diagnosed in May and [facility]. Has had 6 paracentesis since diagnosis and is on high dose diuretics without much help in controlling ascites.  CIWA protocol was added and on first night of hospitalization patient scored high for anxiety and tremors, prompting the nurse to give him 2mg  of Ativan. Patient became somnolent after this and rapid response was called. His pressures were low and he was tachycardic but patient was arousable. He was given a 500cc NS bolus with improvement in pressures and tachycardia. The following morning the decision was made to consult [facility] to see if a transfer  was appropriate. [facility] and agreed to accept patient as a transfer but beds would not be available for several days. In the meantime, she advised us to consider starting antibiotics for SBP prophylaxis, perform a diagnostic paracentesis, and consider moving the patient to a floor to provide pressure support with either Levophed or Midrinone/Octreotide. CCM was consulted. It was determined that patient would not require an ICU bed with Levophed drip and instead was transferred to step down unit to get Mididrone and Octreotide as well as IV albumin. Paracentesis performed, 3L removed. Labs not consistent with SBP.  Acute Renal Failure Creatinine at time of admission 3.39, baseline around 0.98. Patient on high doses of Lasix (120mg  BID) and Spironolactone (200mg  daily) at home and also endorsed limiting his fluid intake over the past week prior to admission to try to prevent more ascites from developing. He was started on gentle fluids to avoid exacerbating ascites. His home diuretics were held. He was continued on Lactulose. Nephrology was consulted at the request of CCM and followed along. Suspicion for hepatorenal syndrome was high initially due to this rapid worsening of renal function in the setting of severe cirrhosis. However, nephrology was encouraged by the fact that his renal function began to improve shortly after admission with gentle fluids, leading them to doubt HRS. Regardless, his risk for developing HRS is high and prognosis is poor. [facility] hepatology team. Creatinine has trended down to 1.07. He did require several fluid boluses and pressure support with Midrinone and Octreotide.   Anxiety Mr. Negrette takes Buspar 5 mg twice a day at home.  Due to continued anxiety in hospital this dose was increased to 7.5 mg twice a day. Patient tolerated this new dose well.  Issues for Follow Up:   1. Progression of cirrhosis  2. Consider liver transplant 3. Continue to monitor anxiety.  Significant Procedures: 9/21 diagnostic paracentesis   Significant Labs and Imaging:   Recent Labs Lab 10/19/15 0220 10/21/15 0147 10/21/15 2055 10/22/15 0500  WBC 20.3* 18.1*  --  17.9*  HGB 13.0 9.1* 11.1* 9.6*  HCT 37.9* 27.5* 31.3* 28.3*  PLT 227 163  --  115*    Recent Labs Lab 10/18/15 1641 10/18/15 2238 10/19/15 0220 10/20/15 0135 10/21/15 0147 10/22/15 0500  NA 124* 125* 125* 127* 127* 132*  K 5.3* 4.7 4.7 5.0 4.6 4.0  CL 92* 96* 99* 99* 101 106  CO2 19* 17* 17* 18* 17* 17*  GLUCOSE 90 101* 98 110* 103* 124*  BUN 59* 55* 55* 54* 50* 32*  CREATININE 3.39* 2.78* 2.75* 2.13* 1.54* 1.07  CALCIUM 8.8* 8.4* 8.2* 8.3* 8.5* 8.4*  ALKPHOS 329*  --  276* 245* 199* 190*  AST 51*  --  43* 40 39 41  ALT 39  --  32 29 24 21   ALBUMIN 2.1*  --  1.8* 2.3* 3.2* 2.8*   Alpha-1-anti-trypsin serum 95  Results/Tests Pending at Time of Discharge: alpha-1 anti-trypsin  Discharge Medications:    Medication List    STOP taking these medications   folic acid 1 MG tablet Commonly known as:  FOLVITE   furosemide 40 MG tablet Commonly known as:  LASIX   omeprazole 20 MG capsule Commonly known as:  PRILOSEC   spironolactone 100 MG tablet Commonly known as:  ALDACTONE   spironolactone 50 MG tablet Commonly known as:  ALDACTONE   thiamine 100 MG tablet   VISINE OP     TAKE these medications   busPIRone 7.5 MG tablet Commonly known as:  BUSPAR Take 1 tablet (7.5 mg total) by mouth 2 (two) times daily. What changed:  medication strength  how much to take   cefTRIAXone 1 g in dextrose 5 % 50 mL Inject 1 g into the vein daily. Start taking on:  10/23/2015   lactulose 10 GM/15ML solution Commonly known as:  CHRONULAC Take 30 mLs (20 g total) by mouth 2 (two) times daily. What changed:  See the new instructions.   midodrine 2.5 MG tablet Commonly known as:   PROAMATINE Take 5 tablets (12.5 mg total) by mouth 3 (three) times daily with meals.   mupirocin ointment 2 % Commonly known as:  BACTROBAN Place 1 application into the nose 2 (two) times daily.   nystatin 100000 UNIT/ML suspension Commonly known as:  MYCOSTATIN Take 5 mLs (500,000 Units total) by mouth 4 (four) times daily.   octreotide 500 mcg in sodium chloride 0.9 % 250 mL Inject 50 mcg/hr into the vein continuous.   pantoprazole 40 MG tablet Commonly known as:  PROTONIX Take 1 tablet (40 mg total) by mouth daily. Start taking on:  10/23/2015   predniSONE 10 MG tablet Commonly known as:  DELTASONE Take 10 mg by mouth daily with breakfast. Pt has been taking for two weeks, 7 days left.  Goes back to dr on 9-27   rifaximin 550 MG Tabs tablet Commonly known as:  XIFAXAN Take 1 tablet (550 mg total) by mouth 2 (two) times daily.   tamsulosin 0.4 MG Caps capsule Commonly known as:  FLOMAX Take 1 capsule (0.4 mg total) by mouth  daily after supper.       Discharge Instructions: Please refer to Patient Instructions section of EMR for full details.  Patient was counseled important signs and symptoms that should prompt return to medical care, changes in medications, dietary instructions, activity restrictions, and follow up appointments.   Follow-Up Appointments: Follow up with Dr. Jennette Kettle after [facility] hospitalization. Please call to schedule this.   Garth Bigness, MD 10/22/2015, 2:18 PM PGY-1, [facility]

## 2015-10-21 ENCOUNTER — Inpatient Hospital Stay (HOSPITAL_COMMUNITY): Payer: Medicaid Other

## 2015-10-21 DIAGNOSIS — K7460 Unspecified cirrhosis of liver: Secondary | ICD-10-CM

## 2015-10-21 DIAGNOSIS — M6281 Muscle weakness (generalized): Secondary | ICD-10-CM

## 2015-10-21 DIAGNOSIS — K769 Liver disease, unspecified: Secondary | ICD-10-CM

## 2015-10-21 LAB — AMMONIA: AMMONIA: 91 umol/L — AB (ref 9–35)

## 2015-10-21 LAB — BODY FLUID CELL COUNT WITH DIFFERENTIAL
Eos, Fluid: 0 %
Lymphs, Fluid: 17 %
Monocyte-Macrophage-Serous Fluid: 19 % — ABNORMAL LOW (ref 50–90)
Neutrophil Count, Fluid: 64 % — ABNORMAL HIGH (ref 0–25)
Total Nucleated Cell Count, Fluid: 22 cu mm (ref 0–1000)

## 2015-10-21 LAB — COMPREHENSIVE METABOLIC PANEL
ALBUMIN: 3.2 g/dL — AB (ref 3.5–5.0)
ALT: 24 U/L (ref 17–63)
AST: 39 U/L (ref 15–41)
Alkaline Phosphatase: 199 U/L — ABNORMAL HIGH (ref 38–126)
Anion gap: 9 (ref 5–15)
BILIRUBIN TOTAL: 2.4 mg/dL — AB (ref 0.3–1.2)
BUN: 50 mg/dL — AB (ref 6–20)
CO2: 17 mmol/L — ABNORMAL LOW (ref 22–32)
CREATININE: 1.54 mg/dL — AB (ref 0.61–1.24)
Calcium: 8.5 mg/dL — ABNORMAL LOW (ref 8.9–10.3)
Chloride: 101 mmol/L (ref 101–111)
GFR calc Af Amer: >60 mL/min (ref >60)
GFR, EST NON AFRICAN AMERICAN: 53 mL/min — AB (ref >60)
GLUCOSE: 103 mg/dL — AB (ref 65–99)
POTASSIUM: 4.6 mmol/L (ref 3.5–5.1)
Sodium: 127 mmol/L — ABNORMAL LOW (ref 135–145)
TOTAL PROTEIN: 5.2 g/dL — AB (ref 6.5–8.1)

## 2015-10-21 LAB — URINALYSIS, ROUTINE W REFLEX MICROSCOPIC
Bilirubin Urine: NEGATIVE
Glucose, UA: NEGATIVE mg/dL
Hgb urine dipstick: NEGATIVE
KETONES UR: NEGATIVE mg/dL
LEUKOCYTES UA: NEGATIVE
NITRITE: NEGATIVE
PH: 6 (ref 5.0–8.0)
PROTEIN: NEGATIVE mg/dL
Specific Gravity, Urine: 1.014 (ref 1.005–1.030)

## 2015-10-21 LAB — HEMOGLOBIN AND HEMATOCRIT, BLOOD
HEMATOCRIT: 31.3 % — AB (ref 39.0–52.0)
Hemoglobin: 11.1 g/dL — ABNORMAL LOW (ref 13.0–17.0)

## 2015-10-21 LAB — CBC
HEMATOCRIT: 27.5 % — AB (ref 39.0–52.0)
HEMOGLOBIN: 9.1 g/dL — AB (ref 13.0–17.0)
MCH: 32.6 pg (ref 26.0–34.0)
MCHC: 33.1 g/dL (ref 30.0–36.0)
MCV: 98.6 fL (ref 78.0–100.0)
Platelets: 163 10*3/uL (ref 150–400)
RBC: 2.79 MIL/uL — AB (ref 4.22–5.81)
RDW: 14.3 % (ref 11.5–15.5)
WBC: 18.1 10*3/uL — AB (ref 4.0–10.5)

## 2015-10-21 LAB — GLUCOSE, SEROUS FLUID: GLUCOSE FL: 104 mg/dL

## 2015-10-21 LAB — ALPHA-1-ANTITRYPSIN: A1 ANTITRYPSIN SER: 95 mg/dL (ref 90–200)

## 2015-10-21 LAB — GRAM STAIN

## 2015-10-21 LAB — LACTATE DEHYDROGENASE, PLEURAL OR PERITONEAL FLUID: LD, Fluid: 44 U/L — ABNORMAL HIGH (ref 3–23)

## 2015-10-21 LAB — PROTEIN, BODY FLUID

## 2015-10-21 LAB — PREPARE RBC (CROSSMATCH)

## 2015-10-21 LAB — ABO/RH: ABO/RH(D): O POS

## 2015-10-21 LAB — TSH: TSH: 1.113 u[IU]/mL (ref 0.350–4.500)

## 2015-10-21 MED ORDER — SODIUM CHLORIDE 0.9 % IV SOLN: Freq: Once | INTRAVENOUS | Status: DC

## 2015-10-21 MED ORDER — LACTULOSE 10 GM/15ML PO SOLN
20.0000 g | Freq: Two times a day (BID) | ORAL | Status: DC
  Administered 2015-10-21 – 2015-10-22 (×2): 20 g via ORAL
  Filled 2015-10-21 (×2): qty 30

## 2015-10-21 MED ORDER — TRAMADOL HCL 50 MG PO TABS
25.0000 mg | ORAL_TABLET | Freq: Once | ORAL | Status: AC
  Administered 2015-10-21: 25 mg via ORAL
  Filled 2015-10-21: qty 1

## 2015-10-21 MED ORDER — TAMSULOSIN HCL 0.4 MG PO CAPS: 0.4000 mg | ORAL_CAPSULE | Freq: Every day | ORAL | Status: DC

## 2015-10-21 MED ORDER — RIFAXIMIN 550 MG PO TABS
550.0000 mg | ORAL_TABLET | Freq: Two times a day (BID) | ORAL | Status: DC
Start: 2015-10-21 — End: 2015-10-22
  Administered 2015-10-21 – 2015-10-22 (×3): 550 mg via ORAL
  Filled 2015-10-21 (×3): qty 1

## 2015-10-21 MED ORDER — ALBUMIN HUMAN 25 % IV SOLN
50.0000 g | Freq: Two times a day (BID) | INTRAVENOUS | Status: DC
  Administered 2015-10-21 – 2015-10-22 (×2): 50 g via INTRAVENOUS
  Filled 2015-10-21 (×3): qty 200

## 2015-10-21 MED ORDER — TAMSULOSIN HCL 0.4 MG PO CAPS
0.4000 mg | ORAL_CAPSULE | Freq: Every day | ORAL | Status: DC
  Administered 2015-10-21: 0.4 mg via ORAL
  Filled 2015-10-21: qty 1

## 2015-10-21 MED ORDER — LACTULOSE 10 GM/15ML PO SOLN
20.0000 g | Freq: Three times a day (TID) | ORAL | Status: DC
  Filled 2015-10-21: qty 30

## 2015-10-21 MED ORDER — LIDOCAINE HCL 1 % IJ SOLN
INTRAMUSCULAR | Status: AC
  Filled 2015-10-21: qty 20

## 2015-10-21 NOTE — Progress Notes (Signed)
PT Cancellation Note  Patient Details Name: Para MarchWilliam Maulden MRN: [id number] DOB: Jun 01, 1970   Cancelled Treatment:    Reason Eval/Treat Not Completed: Patient at procedure or test/unavailable (pt was at US in am.  Will return in pm as able. )   Crystalynn Mcinerney F 10/21/2015, 11:58 AM Eber Jonesawn Lachlan Pelto,PT Acute Rehabilitation [phone])

## 2015-10-21 NOTE — Progress Notes (Addendum)
Michael [name] 45 y.o. Jul 21, 1970  CC:  Ascites, acute kidney injury   Subjective: Patient complaining of worsening abdominal distention . Afebrile. Denied any nausea or vomiting. Improvement in creatinine noted . Hemoglobin dropped to 9.1 from baseline 13 two days ago. Patient stated that he has seen bright blood on the tissue paper after bowel movement. Discussed with the nursing staff. They denied any overt bleeding. Patient is alert and oriented now but per Girlfriend , had some confusion earlier this morning  ROS : Complaining of abdominal distention. Denied nausea vomiting. Denied black tarry stool. Blood on the tissue paper after bowel movement   Objective: Vital signs in last 24 hours: Vitals:   10/21/15 0006 10/21/15 0437  BP: 131/81 112/74  Pulse: (!) 122 (!) 115  Resp: (!) 22 (!) 21  Temp: 98.9 F (37.2 C) 98 F (36.7 C)    Physical Exam:  General:  Alert, cooperative, no distress, appears stated age  Head:  Normocephalic, without obvious abnormality, atraumatic  Eyes:  , EOM's intact, Scleral icterus noted   Lungs:   Fine basilar crackles   Heart:  Regular rate and rhythm, S1, S2 normal  Abdomen:   Distended with ascites , mild left upper quadrant discomfort , BS present   Extremities: 1 + edema        Lab Results:  Recent Labs  10/20/15 0135 10/21/15 0147  NA 127* 127*  K 5.0 4.6  CL 99* 101  CO2 18* 17*  GLUCOSE 110* 103*  BUN 54* 50*  CREATININE 2.13* 1.54*  CALCIUM 8.3* 8.5*    Recent Labs  10/20/15 0135 10/21/15 0147  AST 40 39  ALT 29 24  ALKPHOS 245* 199*  BILITOT 2.7* 2.4*  PROT 5.1* 5.2*  ALBUMIN 2.3* 3.2*    Recent Labs  10/19/15 0220 10/21/15 0147  WBC 20.3* 18.1*  HGB 13.0 9.1*  HCT 37.9* 27.5*  MCV 95.9 98.6  PLT 227 163    Recent Labs  10/18/15 1603 10/20/15 0135  LABPROT 19.0* 21.3*  INR 1.58 1.82      Assessment/Plan: - Acute kidney injury in setting of diuretics use  for ascites.Pre-renal. Less likely Hepatorenal . Improving. Hold diuretics for now. Cr improved to 1.54 - Acute drop in hemoglobin. Patient complaining of bright blood on the tissue paper after bowel movement - Alcoholic cirrhosis. Decompensated. - Refractory ascites - possible low volume paracentesis today - Leukocytosis - Abnormal LFTs. From  decompensated liver disease - History of hepatic encephalopathy - Hypotension  Recommendations -------------------------- - We'll DC albumin today. Continue octreotide for one more day - Recommend avoiding large volume paracentesis.  - Monitor H&H. Discussed with nursing staff to be on lookout for overt bleeding - Continue  midodrine  for now - We will add Xifaxan  - Based on chart review it looks [facility] but [facility] - Patient's prognosis remains poor - GI will follow   Kathi DerParag Wilhelmenia Addis MD, FACP 10/21/2015, 8:33 AM  Pager [phone]  If no answer or after 5 PM call [phone]

## 2015-10-21 NOTE — Progress Notes (Signed)
PT Cancellation Note  Patient Details Name: Michael [name] MRN: [id number] DOB: 08/02/1970   Cancelled Treatment:    Reason Eval/Treat Not Completed: Patient at procedure or test/unavailable   Getting PICC placed. Will attempt 9/23   Michael [name] 10/21/2015, 4:20 PM Pager [phone]

## 2015-10-21 NOTE — Progress Notes (Signed)
Family Medicine Teaching Service Daily Progress Note Intern Pager: [phone]  Patient name: Michael [name] Medical record number: [id number] Date of birth: 1970/08/27 Age: 45 y.o. Gender: male  Primary Care Provider: Massie MaroonHollis,Lachina M, FNP Consultants: GI, Renal, CCM Code Status: FULL  Pt Overview and Major Events to Date:  [facility] transferred to step down unit for octreotide and midodrine   Assessment and Plan: Michael [name] is a 45 y.o. male presenting with acute kidney failure. PMH is significant for Cirrhosis diagnosed in May 2017 and anxiety.   Acute Renal Failure Noted on outpatient labs yesterday via [facility] that creatinine had increased from 1 to 3, was instructed to come to ED for further evaluation. Creatinine in ED 3.3. Patient reports taking high dose lasix and spironolactone at home to aid in preventing ascites. Has also been trying to restrict his fluid intake in the week prior to admission to try to prevent fluid collection. Initial suspicion for pre-renal physiology in the setting of diuretic use and PO restriction. Given his advanced liver disease, hepatorenal syndrome should be considered although it is a diagnosis of exclusion.  -vital signs per step downunit  -nephrology following, appreciate their recommendations -continue gentle IV fluids half NS 14050mL/hr, bolus as needed -patient has remained tachycardic will check TSH -trend BMP, creatinine trending down from to 1.54 today, baseline around 0.7-1 from June-August -strict I/Os  -concern remains high for hepatorenal syndrome, -continue IV albumin for intravascular repletion -continue Midodrine 10mg  TID and Octreotide infusion in step down unit for pressure support -goal is to keep map >80  Alcoholic Cirrhosis Was diagnosed in May. MELD score of 33 with 19.6% estimated 3 month mortality. Last drink May 13th 2017. Last paracentesis on 9/9 where 3L removed, no signs of SBP. Follows closely with [facility] for labs. Hepatitis C and HIV negative. Concerning for [facility] given severity of cirrhosis in short time span. Over past 2.5 weeks has been increase in fatigue. Has lost appetite past 5 days. Patient is mentating appropriately. CT abdomen from July 2017 showed cirrhosis with portal venous HTN, portal venous collaterals, esophageal varices and ascites. It was without hepatic lesions to suggest HCC or metastasis. Although patient has leukocytosis (appears to be chronic for several months), his abdomen is not remarkably tender and he is afebrile so low suspicion for SBP but would recommend paracentesis for further evaluation.  -[facility] following, appreciate recommendation -ammonia increased to 90 with some confusion this morning, continueLactulose 20mg  to BID with goal of 2-3 soft bowel movements a day -GI added Rifaximin 550mg  BID -abdominal ultrasound demonstrated moderate ascites, would likely benefit from paracentesis with upper limit of 3L removal so as not to worsen renal function, if not for just symptomatic relief -will try again for paracentesis today, max 3L removal -patient on CTX for SBP prophylaxis -PT consult for weakness, they are following, have recommended home health PT -continue home prednisone taper (10mg  daily) -continue home vitamins, thiamine and folate -continue home protonix -continue CIWA scores but discontinued Ativan, scoring for tremor -holding home Lasix and Spironolactone at this time -trend LFT's, coag studies  Anemia Hemoglobin 13 on admission, 9.1 today. Has history of hemorrhoids and reports bright red blood on tissue paper after bowel movements, no dark tarry stools or overt rectal bleeding -continue to monitor -will transfuse 1 unit PRBC today, will also help with intravascular repletion -post transfusion H/H  Lactic acidosis Was initially checked by rapid response team, was high and trended but remained high.  Patient without signs of infection, likely elevated  due to disease process/inflammation -no longer trending, will remain high in setting of cirrhosis  Hyponatremia: Na 124 at admission. Likely related to patient's cirrhosis. Appears to be chronically hyponatremic on previous labs. Patient has no neuro deficits on exam.  -chronically low in setting of cirrhosis -continue to monitor mental status -follow on BMET, Na 127 today -limiting Sodium in fluids as it will likely just be lost to ascites  Pulmonary Nodules: Seen on CT chest from July 2017. Findings of numerous pulmonary nodules which could be suggestive of metastatic disease. Pulmonology saw patient in clinic. Recommended repeat CT in 2 months. AFP negative and autoimmune workup unremarkable.  -repeat chest CT outpatient as scheduled  Wrist Abscess Has been there for "a while", WBC chronically elevated, is 22 in ED today, patient has also been on steroids per GI for several months. Unlikely to be due to this wrist abscess but will treat -monitor for now, on antibiotics for prophylaxis of SBP  Thrush On chronic steroids, currently with thrush symptoms. HIV negative. Had been prescribed Nystatin outpatient but never filled Rx.  - nystatin oral solution four times daily  Anxiety Patient reports multiple stressors at home including divorce, death of a child, financial troubles on top of his new medical issues -increased home buspar from 5mg  BID to 7.5 BID -consult CSW and CM   FEN/GI: regular diet Prophylaxis: lovenox  Disposition: continue management in step down unit, possible transfer to [facility] once bed becomes available  Subjective:  [name] is uncomfortable this morning, frustrated he did not get paracentesis yesterday. Feels uncomfortable. Some confusion according to fiance for the past few hours this morning. No nausea or vomiting.   Objective: Temp:  [98 F (36.7 C)-98.9 F (37.2 C)] 98.5 F (36.9 C) (09/22 0800) Pulse Rate:  [109-122] 113 (09/22 0800) Resp:  [18-23]  23 (09/22 0800) BP: (110-141)/(73-99) 141/99 (09/22 0800) SpO2:  [96 %-99 %] 96 % (09/22 0800) Physical Exam: General: chronically ill appearing, laying in bed, alert and oriented, no acute distress Cardiovascular: RRR no murmurs rubs or gallops Respiratory: CTA bilaterally with normal work of breathing Abdomen: distended, faint bowel sounds, non-tender, + fluid wave Extremities: no edema or cyanosis Skin: abscess of right wrist with erythema and scabbing, dried blood around this area today, bruises notes over forearms and legs      Laboratory:  Recent Labs Lab 10/18/15 1641 10/19/15 0220 10/21/15 0147  WBC 23.0* 20.3* 18.1*  HGB 14.7 13.0 9.1*  HCT 41.3 37.9* 27.5*  PLT 260 227 163    Recent Labs Lab 10/19/15 0220 10/20/15 0135 10/21/15 0147  NA 125* 127* 127*  K 4.7 5.0 4.6  CL 99* 99* 101  CO2 17* 18* 17*  BUN 55* 54* 50*  CREATININE 2.75* 2.13* 1.54*  CALCIUM 8.2* 8.3* 8.5*  PROT 4.8* 5.1* 5.2*  BILITOT 3.4* 2.7* 2.4*  ALKPHOS 276* 245* 199*  ALT 32 29 24  AST 43* 40 39  GLUCOSE 98 110* 103*   Ammonia: 70 Lactic Acid: 2.5 > 2.2 > 2.8  Imaging/Diagnostic Tests: Dg Chest 2 View  Result Date: 10/19/2015 CLINICAL DATA:  Follow-up hepatorenal syndrome EXAM: CHEST  2 VIEW COMPARISON:  08/27/2015 FINDINGS: Trace bilateral pleural effusion. Low volume chest with pulmonary vascular congestion. Small nodular foci seen previously are less conspicuous. Normal heart size and mediastinal contours. IMPRESSION: 1. Decreasing conspicuity of bilateral pulmonary nodules seen 08/27/2015. 2. Trace pleural effusions. Electronically Signed   By: Marja Kays  Watts [name].D.   On: 10/19/2015 14:53   US Renal  Result Date: 10/19/2015 CLINICAL DATA:  Hepatorenal syndrome. EXAM: RENAL / URINARY TRACT ULTRASOUND COMPLETE COMPARISON:  CT of 08/27/2015 FINDINGS: Right Kidney: Length: 9.7 cm. Echogenicity within normal limits. No mass or hydronephrosis visualized. Left Kidney: Length: 9.2 cm.  Echogenicity within normal limits. No mass or hydronephrosis visualized. Bladder: Decompressed urinary bladder. Note is made of large volume ascites. IMPRESSION: No hydronephrosis or explanation for elevated creatinine. Ascites. Electronically Signed   By: Jeronimo Greaves [name].D.   On: 10/19/2015 18:26    Tillman Sers, DO 10/21/2015, 9:19 AM PGY-1, [facility] FPTS Intern pager: [phone], text pages welcome

## 2015-10-21 NOTE — Progress Notes (Signed)
Peripherally Inserted Central Catheter Exchange  The IV Nurse has discussed with the patient and/or persons authorized to consent for the patient, the purpose of this procedure and the potential benefits and risks involved with this procedure.  The benefits include less needle sticks, lab draws from the catheter, and the patient may be discharged home with the catheter. Risks include, but not limited to, infection, bleeding, blood clot (thrombus formation), and puncture of an artery; nerve damage and irregular heartbeat and possibility to perform a PICC exchange if needed/ordered by physician.  Alternatives to this procedure were also discussed.  Bard Power PICC patient education guide, fact sheet on infection prevention and patient information card has been provided to patient /or left at bedside.   Note:  Skin tear along border of old dressing.  Site dressed with transparent dressing.  PICC/Midline Placement Documentation  PICC Triple Lumen 10/21/15 PICC Left Brachial 45 cm 0 cm (Active)  Indication for Insertion or Continuance of Line Limited venous access - need for IV therapy >5 days (PICC only) 10/21/2015  2:37 PM  Exposed Catheter (cm) 0 cm 10/21/2015  2:37 PM  Site Assessment Clean;Dry;Intact;Bleeding 10/21/2015  2:37 PM  Lumen #1 Status Flushed;Saline locked;Blood return noted 10/21/2015  2:37 PM  Lumen #2 Status Flushed;Saline locked;Blood return noted 10/21/2015  2:37 PM  Lumen #3 Status Flushed;Saline locked;Blood return noted 10/21/2015  2:37 PM  Dressing Type Transparent;Occlusive 10/21/2015  2:37 PM  Dressing Status Clean;Dry;Intact;Other (Comment) 10/21/2015  2:37 PM  Dressing Intervention New dressing 10/21/2015  2:37 PM  Dressing Change Due 10/28/15 10/21/2015  2:37 PM       Michael [name], Ranell Finelli Robert 10/21/2015, 2:40 PM

## 2015-10-21 NOTE — Progress Notes (Signed)
[facility] Rounding Note  Subjective/Interval History:   Diagnostic paracentesis today Renal function continues to improve, UOP picking up GI managing ocrterotide/midodrine.  Objective Vital signs in last 24 hours: Vitals:   10/20/15 2002 10/21/15 0006 10/21/15 0437 10/21/15 0800  BP: 110/77 131/81 112/74 (!) 141/99  Pulse: (!) 120 (!) 122 (!) 115 (!) 113  Resp: (!) 22 (!) 22 (!) 21 (!) 23  Temp: 98.7 F (37.1 C) 98.9 F (37.2 C) 98 F (36.7 C) 98.5 F (36.9 C)  TempSrc: Oral Oral Oral Oral  SpO2: 99% 97% 97% 96%  Weight:      Height:       Weight change:   Intake/Output Summary (Last 24 hours) at 10/21/15 1101 Last data filed at 10/21/15 1039  Gross per 24 hour  Intake             2965 ml  Output              825 ml  Net             2140 ml   Physical Exam:  Blood pressure (!) 141/99, pulse (!) 113, temperature 98.5 F (36.9 C), temperature source Oral, resp. rate (!) 23, height 5\' 8"  (1.727 m), weight 64.2 kg (141 lb 9.6 oz), SpO2 96 %.   Bearded WM, scleral icterus VS as noted with improvement in BP Excoriations on lower legs from scratching No JVD Lungs clear Tachy 115 S1S2 No S3 or murmur. Protuberant, distended, tight  abdomen. No edema  Labs: Basic Metabolic Panel:  Recent Labs Lab 10/18/15 1641 10/18/15 2238 10/19/15 0220 10/20/15 0135 10/21/15 0147  NA 124* 125* 125* 127* 127*  K 5.3* 4.7 4.7 5.0 4.6  CL 92* 96* 99* 99* 101  CO2 19* 17* 17* 18* 17*  GLUCOSE 90 101* 98 110* 103*  BUN 59* 55* 55* 54* 50*  CREATININE 3.39* 2.78* 2.75* 2.13* 1.54*  CALCIUM 8.8* 8.4* 8.2* 8.3* 8.5*     Recent Labs Lab 10/19/15 0220 10/20/15 0135 10/21/15 0147  AST 43* 40 39  ALT 32 29 24  ALKPHOS 276* 245* 199*  BILITOT 3.4* 2.7* 2.4*  PROT 4.8* 5.1* 5.2*  ALBUMIN 1.8* 2.3* 3.2*    Recent Labs Lab 10/18/15 1641  LIPASE 113*    Recent Labs Lab 10/19/15 0220 10/21/15 0206  AMMONIA 70* 91*   CBC:  Recent Labs Lab 10/18/15 1641 10/19/15 0220  10/21/15 0147  WBC 23.0* 20.3* 18.1*  HGB 14.7 13.0 9.1*  HCT 41.3 37.9* 27.5*  MCV 95.6 95.9 98.6  PLT 260 227 163   Studies/Results: Dg Chest 2 View  Result Date: 10/19/2015 CLINICAL DATA:  Follow-up hepatorenal syndrome EXAM: CHEST  2 VIEW COMPARISON:  08/27/2015 FINDINGS: Trace bilateral pleural effusion. Low volume chest with pulmonary vascular congestion. Small nodular foci seen previously are less conspicuous. Normal heart size and mediastinal contours. IMPRESSION: 1. Decreasing conspicuity of bilateral pulmonary nodules seen 08/27/2015. 2. Trace pleural effusions. Electronically Signed   By: Marnee Spring M.D.   On: 10/19/2015 14:53   US Renal  Result Date: 10/19/2015 CLINICAL DATA:  Hepatorenal syndrome. EXAM: RENAL / URINARY TRACT ULTRASOUND COMPLETE COMPARISON:  CT of 08/27/2015 FINDINGS: Right Kidney: Length: 9.7 cm. Echogenicity within normal limits. No mass or hydronephrosis visualized. Left Kidney: Length: 9.2 cm. Echogenicity within normal limits. No mass or hydronephrosis visualized. Bladder: Decompressed urinary bladder. Note is made of large volume ascites. IMPRESSION: No hydronephrosis or explanation for elevated creatinine. Ascites. Electronically Signed   By:  Jeronimo GreavesKyle  Talbot M.D.   On: 10/19/2015 18:26   Medications: . octreotide  (SANDOSTATIN)    IV infusion 50 mcg/hr (10/21/15 0029)  . sodium chloride 0.45 % 1,000 mL infusion 150 mL/hr at 10/20/15 2033   . albumin human  50 g Intravenous BID  . busPIRone  7.5 mg Oral BID  . cefTRIAXone (ROCEPHIN)  IV  1 g Intravenous Q24H  . Chlorhexidine Gluconate Cloth  6 each Topical Q0600  . enoxaparin (LOVENOX) injection  40 mg Subcutaneous Q24H  . folic acid  1 mg Oral Daily  . lactulose  20 g Oral BID  . lidocaine      . midodrine  12.5 mg Oral TID WC  . multivitamin with minerals  1 tablet Oral Daily  . mupirocin ointment  1 application Nasal BID  . nystatin  5 mL Oral QID  . pantoprazole  40 mg Oral Daily  .  predniSONE  10 mg Oral Q breakfast  . rifaximin  550 mg Oral BID  . thiamine  100 mg Oral Daily    Background: 45 y.o. year-old with a history of alcohol abuse who was diagnosed 05/2015 with alcoholic cirrhosis 05/2015 after presenting with edema, abdominal distension and ascites (new diagnosis for him at that time, and he has not consumed alcohol since then).  He was treated at that time with steroids for alcoholic hepatitis, and had paracenteses X 2 during that admission.  Imaging studies showed cirrhosis, portal v collaterals, esophageal varices and ascites.  He had an episode of AKI on CKD during that admission with creatinine that bumped from 0.99 up to 1.58, but improved back to a creatinine around 1. Has had paracentesis X 3 since then (7/29, 8/7, 9/5). He is admitted on the current occasion with abdominal pain, 1 week of n/v/poor por intake, and a creatinine that increased from 1 ->3--> 3.39 on admission and we were asked to see.  Assessment/Recommendations  1. AKI - steady improvement in renal function since admission with volume expansion so this was more likely pre-renal from intravascular volume depletion and less likely HRS. (though I think he was correctly treated for the possibility of HRS until renal function declared itself). I have deferred to GI re the time course for continuation of octreotide/midodrine as well as fluids.  (I think albumin should be discontinued as per GI and IVF could be decreased...). He is still at risk for HRS in the future.  2. Hyponatremia - improving. Chronic. Related to his cirrhosis. 3. Alcoholic cirrhosis with h/o alcoholic hepatitis - per GI 4. Refractory ascites - my thoughts on LV paracentesis noted above.  5. ? SBP - as above. On empiric rocephin. Getting diagnostic tap today 6. MRSA PCR + 7. BRB on toilet tissue with drop in Hb past 2 days (may be in part hydration) - per GI  At this time with improving renal function UOP, Nephrology subspecialty  care no longer needed. Renal will sing off. Please call if questions or issues we can help with.  Camille Balynthia Jerico Grisso, MD [facility] [phone] pager 10/21/2015, 11:01 AM

## 2015-10-22 LAB — TYPE AND SCREEN
ABO/RH(D): O POS
ANTIBODY SCREEN: NEGATIVE
Unit division: 0

## 2015-10-22 LAB — CBC
HCT: 28.3 % — ABNORMAL LOW (ref 39.0–52.0)
Hemoglobin: 9.6 g/dL — ABNORMAL LOW (ref 13.0–17.0)
MCH: 32.8 pg (ref 26.0–34.0)
MCHC: 33.9 g/dL (ref 30.0–36.0)
MCV: 96.6 fL (ref 78.0–100.0)
PLATELETS: 115 10*3/uL — AB (ref 150–400)
RBC: 2.93 MIL/uL — ABNORMAL LOW (ref 4.22–5.81)
RDW: 14.5 % (ref 11.5–15.5)
WBC: 17.9 10*3/uL — AB (ref 4.0–10.5)

## 2015-10-22 LAB — COMPREHENSIVE METABOLIC PANEL
ALBUMIN: 2.8 g/dL — AB (ref 3.5–5.0)
ALT: 21 U/L (ref 17–63)
AST: 41 U/L (ref 15–41)
Alkaline Phosphatase: 190 U/L — ABNORMAL HIGH (ref 38–126)
Anion gap: 9 (ref 5–15)
BUN: 32 mg/dL — AB (ref 6–20)
CHLORIDE: 106 mmol/L (ref 101–111)
CO2: 17 mmol/L — ABNORMAL LOW (ref 22–32)
Calcium: 8.4 mg/dL — ABNORMAL LOW (ref 8.9–10.3)
Creatinine, Ser: 1.07 mg/dL (ref 0.61–1.24)
GFR calc Af Amer: >60 mL/min (ref >60)
Glucose, Bld: 124 mg/dL — ABNORMAL HIGH (ref 65–99)
POTASSIUM: 4 mmol/L (ref 3.5–5.1)
Sodium: 132 mmol/L — ABNORMAL LOW (ref 135–145)
Total Bilirubin: 3.6 mg/dL — ABNORMAL HIGH (ref 0.3–1.2)
Total Protein: 4.8 g/dL — ABNORMAL LOW (ref 6.5–8.1)

## 2015-10-22 LAB — PROTIME-INR
INR: 1.9
PROTHROMBIN TIME: 22.1 s — AB (ref 11.4–15.2)

## 2015-10-22 MED ORDER — CEFTRIAXONE SODIUM 1 G IJ SOLR: 1.0000 g | INTRAMUSCULAR | Status: AC

## 2015-10-22 MED ORDER — BUSPIRONE HCL 7.5 MG PO TABS: 7.5000 mg | ORAL_TABLET | Freq: Two times a day (BID) | ORAL | Status: DC

## 2015-10-22 MED ORDER — PANTOPRAZOLE SODIUM 40 MG PO TBEC: 40.0000 mg | DELAYED_RELEASE_TABLET | Freq: Every day | ORAL | Status: AC

## 2015-10-22 MED ORDER — LACTULOSE 10 GM/15ML PO SOLN: 20.0000 g | Freq: Two times a day (BID) | ORAL | 0 refills | Status: AC

## 2015-10-22 MED ORDER — TAMSULOSIN HCL 0.4 MG PO CAPS: 0.4000 mg | ORAL_CAPSULE | Freq: Every day | ORAL | Status: AC

## 2015-10-22 MED ORDER — RIFAXIMIN 550 MG PO TABS: 550.0000 mg | ORAL_TABLET | Freq: Two times a day (BID) | ORAL | Status: AC

## 2015-10-22 MED ORDER — NYSTATIN 100000 UNIT/ML MT SUSP: 5.0000 mL | Freq: Four times a day (QID) | OROMUCOSAL | 0 refills | Status: AC

## 2015-10-22 MED ORDER — SODIUM CHLORIDE 0.9 % IV SOLN: 50.0000 ug/h | INTRAVENOUS | Status: AC

## 2015-10-22 MED ORDER — CHLORHEXIDINE GLUCONATE CLOTH 2 % EX PADS
6.0000 | MEDICATED_PAD | Freq: Every day | CUTANEOUS | Status: DC
  Administered 2015-10-22: 6 via TOPICAL

## 2015-10-22 MED ORDER — CHLORHEXIDINE GLUCONATE CLOTH 2 % EX PADS: 6.0000 | MEDICATED_PAD | Freq: Every day | CUTANEOUS | Status: DC

## 2015-10-22 MED ORDER — MUPIROCIN 2 % EX OINT: 1.0000 "application " | TOPICAL_OINTMENT | Freq: Two times a day (BID) | CUTANEOUS | 0 refills | Status: AC

## 2015-10-22 MED ORDER — MIDODRINE HCL 2.5 MG PO TABS: 12.5000 mg | ORAL_TABLET | Freq: Three times a day (TID) | ORAL | Status: AC

## 2015-10-22 NOTE — Progress Notes (Signed)
[facility]. Report called to [facility]. [facility] called for transport. [facility] should be here for transport a in about 1 hour. Patient and family informed. Will continue to monitor patient.

## 2015-10-22 NOTE — Plan of Care (Signed)
Problem: Education: Goal: Knowledge of [facility] Education information/materials will improve Outcome: Progressing Discussed with patient and family throughout the shift about plan of care and medications.

## 2015-10-22 NOTE — Progress Notes (Signed)
Family Medicine Teaching Service Daily Progress Note Intern Pager: [phone]  Patient name: Michael [name] Medical record number: [id number] Date of birth: 02/23/1970 Age: 45 y.o. Gender: male  Primary Care Provider: Massie MaroonHollis,Lachina M, FNP Consultants: GI Code Status: FULL  Pt Overview and Major Events to Date:  [facility] transferred to step down unit for octreotide and midodrine   Assessment and Plan: Michael [name] is a 45 y.o. male presenting with acute kidney failure. PMH is significant for Cirrhosis diagnosed in May 2017 and anxiety.   Acute Renal Failure: Creatinine in ED 3.3. Taking high dose lasix and spironolactone at home to prevent ascites prior to admissions. Now resolved with Cr 1.07 (9/23). Nephro signed off.  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 MELD score of 33 with 19.6% estimated 3 month mortality. Last drink May 13th 2017. Hep C, HIV neg. [facility] severity [facility] in short time span. Ct abd (07/17) with cirrhosis w/portal venous HTN, esophageal varices, ascites, but without hepatic lesions to suggest HCC or metastasis. Abd US with moderate ascites. S/p paracentesis x2 (9/9, 9/23). [facility] following, appreciate recs - Cont rifaximin BID per GI - Cont lactulose 20mg  BID (goal 2-3 soft BM/day) - Cont CTX for SBP prophylaxis - PT following, recommending HH - Continue home prednisone taper (10mg  daily) - Continue home vitamins, thiamine and folate - Continue home protonix - Hold home Lasix and spironolactone  - Discontinue albumin  Anemia Hemoglobin 13 on admission. S/p 1U PRBC 9/22. Decreased from 11.1 post-transfusion to 9.6 this AM (9/23). GI noted Hgb drop in yesterday's note.    - Continue to monitor - F/u GI consult and any recs regarding decreasing Hgb - Lovenox held given concern for active bleed  Lactic acidosis Was initially checked by rapid response team, remained high throughout trending. No signs of infection. Likely elevated due to disease process/inflammation. -No longer trending, will remain high in setting of cirrhosis  Hyponatremia: Na 124 at admission. Likely related to patient's cirrhosis. Appears to be chronically hyponatremic on previous labs. Improved to 132 this AM. (9/23). - Continue to monitor mental status - Continue to monitor - Limiting Na in fluids as it will likely just be lost to ascites  Pulmonary Nodules: Seen on CT chest from July 2017. Possibly suggestive of metastatic disease. Repeat CT in 2 mo per outpatient pulmonology. AFP neg, autoimmune workup unremarkable.  -Repeat chest CT outpatient as scheduled  Wrist Abscess: WBC improving to 17.9. Afebrile. On CTX for SBP prophylaxis but would also cover possible causative organisms. - Continue to monitor - Continue CTX  Thrush: On chronic steroids, currently with thrush symptoms. HIV negative.  - Nystatin oral solution four times daily  Anxiety: Patient reports multiple stressors at home including divorce, death of a child, financial troubles on top of his new medical issues - Continue buspar 7.5mg  BID - SW consulted  FEN/GI: regular diet, 1/2NS@75  cc/hr Prophylaxis: Lovenox (held today given concern for GI bleed)  Disposition: continue management in step down unit, possible [facility] once bed becomes available  Subjective:  Some abdominal tightness today. Reports distention returned soon after paracentesis yesterday. No SOB, chest pain. Reports dark stools but says he thinks this is due to  a hemorrhoid.   Objective: Temp:  [98 F (36.7 C)-98.7 F (37.1 C)] 98.2 F (36.8 C) (09/23 0800) Pulse Rate:  [93-128] 98 (09/23 0800) Resp:  [15-30] 18 (09/23 0800) BP:  (111-149)/(78-98) 118/80 (09/23 0800) SpO2:  [93 %-99 %] 97 % (09/23 0800) Physical Exam: General: chronically ill appearing, sitting up in bed, NAD Cardiovascular: RRR, no murmurs appreciated Respiratory: CTA bilaterally with normal work of breathing Abdomen: distended, non-tender, +BS Skin: abscess of right wrist with erythema and scabbing, bruises notes over forearms and legs      Laboratory:  Recent Labs Lab 10/19/15 0220 10/21/15 0147 10/21/15 2055 10/22/15 0500  WBC 20.3* 18.1*  --  17.9*  HGB 13.0 9.1* 11.1* 9.6*  HCT 37.9* 27.5* 31.3* 28.3*  PLT 227 163  --  115*    Recent Labs Lab 10/20/15 0135 10/21/15 0147 10/22/15 0500  NA 127* 127* 132*  K 5.0 4.6 4.0  CL 99* 101 106  CO2 18* 17* 17*  BUN 54* 50* 32*  CREATININE 2.13* 1.54* 1.07  CALCIUM 8.3* 8.5* 8.4*  PROT 5.1* 5.2* 4.8*  BILITOT 2.7* 2.4* 3.6*  ALKPHOS 245* 199* 190*  ALT 29 24 21   AST 40 39 41  GLUCOSE 110* 103* 124*   Ammonia: 70 Lactic Acid: 2.5 > 2.2 > 2.8 TSH: 1.1  Imaging/Diagnostic Tests: US Paracentesis  Result Date: 10/21/2015 INDICATION: Abdominal distention secondary recurrent ascites. Request diagnostic and therapeutic thoracentesis up to 3 L max. EXAM: ULTRASOUND GUIDED RIGHT LOWER QUADRANT PARACENTESIS MEDICATIONS: None. COMPLICATIONS: None immediate. PROCEDURE: Informed written consent was obtained from the patient after a discussion of the risks, benefits and alternatives to treatment. A timeout was performed prior to the initiation of the procedure. Initial ultrasound scanning demonstrates a large amount of ascites within the right lower abdominal quadrant. The right lower abdomen was prepped and draped in the usual sterile fashion. 1% lidocaine with epinephrine was used for local anesthesia. Following this, a 19 gauge, 7-cm, Yueh catheter was introduced. An ultrasound image was saved for documentation purposes. The paracentesis was performed. The catheter was removed and a  dressing was applied. The patient tolerated the procedure well without immediate post procedural complication. FINDINGS: A total of approximately 3 L of clear yellow fluid was removed. Samples were sent to the laboratory as requested by the clinical team. IMPRESSION: Successful ultrasound-guided paracentesis yielding 3 liters of peritoneal fluid. Read by: Brayton El PA-C Electronically Signed   By: Gilmer Mor D.O.   On: 10/21/2015 12:30    Marquette Saa, MD 10/22/2015, 11:12 AM PGY-2, [facility] Family Medicine FPTS Intern pager: [phone], text pages welcome

## 2015-10-22 NOTE — Progress Notes (Signed)
[facility] called and asked if patient still needed a bed and yes was the answer.

## 2015-10-22 NOTE — Progress Notes (Signed)
[facility]

## 2015-10-23 LAB — URINE CULTURE: CULTURE: NO GROWTH

## 2015-10-24 LAB — PATHOLOGIST SMEAR REVIEW

## 2015-10-26 LAB — CULTURE, BODY FLUID W GRAM STAIN -BOTTLE: Culture: NO GROWTH

## 2015-10-26 MED FILL — PANTOPRAZOLE SOD DR 40 MG T: 40 | 30 days supply | Qty: 30 | Fill #3

## 2015-10-27 ENCOUNTER — Inpatient Hospital Stay (HOSPITAL_COMMUNITY): Payer: Medicaid Other

## 2015-10-27 ENCOUNTER — Encounter (HOSPITAL_COMMUNITY): Payer: Self-pay | Admitting: Emergency Medicine

## 2015-10-27 ENCOUNTER — Inpatient Hospital Stay (HOSPITAL_COMMUNITY)
Admission: EM | Admit: 2015-10-27 | Discharge: 2015-10-27 | DRG: 432 | Discharge status: Against Medical Advice | Payer: Medicaid Other | Attending: Family Medicine | Admitting: Family Medicine

## 2015-10-27 DIAGNOSIS — K649 Unspecified hemorrhoids: Secondary | ICD-10-CM | POA: Diagnosis present

## 2015-10-27 DIAGNOSIS — K767 Hepatorenal syndrome: Secondary | ICD-10-CM | POA: Diagnosis present

## 2015-10-27 DIAGNOSIS — Z833 Family history of diabetes mellitus: Secondary | ICD-10-CM | POA: Diagnosis not present

## 2015-10-27 DIAGNOSIS — Z79899 Other long term (current) drug therapy: Secondary | ICD-10-CM | POA: Diagnosis not present

## 2015-10-27 DIAGNOSIS — R7989 Other specified abnormal findings of blood chemistry: Secondary | ICD-10-CM | POA: Diagnosis present

## 2015-10-27 DIAGNOSIS — R000 Tachycardia, unspecified: Secondary | ICD-10-CM | POA: Diagnosis present

## 2015-10-27 DIAGNOSIS — F419 Anxiety disorder, unspecified: Secondary | ICD-10-CM | POA: Diagnosis present

## 2015-10-27 DIAGNOSIS — R188 Other ascites: Secondary | ICD-10-CM

## 2015-10-27 DIAGNOSIS — K7210 Chronic hepatic failure without coma: Secondary | ICD-10-CM | POA: Insufficient documentation

## 2015-10-27 DIAGNOSIS — Z7952 Long term (current) use of systemic steroids: Secondary | ICD-10-CM | POA: Diagnosis not present

## 2015-10-27 DIAGNOSIS — N179 Acute kidney failure, unspecified: Secondary | ICD-10-CM | POA: Diagnosis present

## 2015-10-27 DIAGNOSIS — K7031 Alcoholic cirrhosis of liver with ascites: Principal | ICD-10-CM | POA: Diagnosis present

## 2015-10-27 DIAGNOSIS — Z87891 Personal history of nicotine dependence: Secondary | ICD-10-CM | POA: Insufficient documentation

## 2015-10-27 DIAGNOSIS — I9589 Other hypotension: Secondary | ICD-10-CM

## 2015-10-27 DIAGNOSIS — D649 Anemia, unspecified: Secondary | ICD-10-CM | POA: Diagnosis present

## 2015-10-27 DIAGNOSIS — R140 Abdominal distension (gaseous): Secondary | ICD-10-CM | POA: Diagnosis present

## 2015-10-27 DIAGNOSIS — I959 Hypotension, unspecified: Secondary | ICD-10-CM | POA: Diagnosis present

## 2015-10-27 LAB — URINALYSIS, ROUTINE W REFLEX MICROSCOPIC
Bilirubin Urine: NEGATIVE
Glucose, UA: NEGATIVE mg/dL
HGB URINE DIPSTICK: NEGATIVE
Ketones, ur: NEGATIVE mg/dL
Leukocytes, UA: NEGATIVE
Nitrite: NEGATIVE
PH: 5.5 (ref 5.0–8.0)
Protein, ur: NEGATIVE mg/dL
SPECIFIC GRAVITY, URINE: 1.018 (ref 1.005–1.030)

## 2015-10-27 LAB — COMPREHENSIVE METABOLIC PANEL
ALBUMIN: 2.9 g/dL — AB (ref 3.5–5.0)
ALT: 35 U/L (ref 17–63)
ANION GAP: 9 (ref 5–15)
AST: 57 U/L — AB (ref 15–41)
Alkaline Phosphatase: 264 U/L — ABNORMAL HIGH (ref 38–126)
BUN: 34 mg/dL — AB (ref 6–20)
CO2: 15 mmol/L — AB (ref 22–32)
Calcium: 8.8 mg/dL — ABNORMAL LOW (ref 8.9–10.3)
Chloride: 104 mmol/L (ref 101–111)
Creatinine, Ser: 1.89 mg/dL — ABNORMAL HIGH (ref 0.61–1.24)
GFR calc Af Amer: 48 mL/min — ABNORMAL LOW (ref >60)
GFR calc non Af Amer: 41 mL/min — ABNORMAL LOW (ref >60)
GLUCOSE: 92 mg/dL (ref 65–99)
POTASSIUM: 4.4 mmol/L (ref 3.5–5.1)
SODIUM: 128 mmol/L — AB (ref 135–145)
Total Bilirubin: 3.3 mg/dL — ABNORMAL HIGH (ref 0.3–1.2)
Total Protein: 5.3 g/dL — ABNORMAL LOW (ref 6.5–8.1)

## 2015-10-27 LAB — CBC WITH DIFFERENTIAL/PLATELET
BASOS PCT: 0 %
Basophils Absolute: 0 10*3/uL (ref 0.0–0.1)
EOS ABS: 0 10*3/uL (ref 0.0–0.7)
EOS PCT: 0 %
HEMATOCRIT: 34.9 % — AB (ref 39.0–52.0)
HEMOGLOBIN: 11.7 g/dL — AB (ref 13.0–17.0)
LYMPHS PCT: 12 %
Lymphs Abs: 2.9 10*3/uL (ref 0.7–4.0)
MCH: 33 pg (ref 26.0–34.0)
MCHC: 33.5 g/dL (ref 30.0–36.0)
MCV: 98.3 fL (ref 78.0–100.0)
MONOS PCT: 6 %
Monocytes Absolute: 1.4 10*3/uL — ABNORMAL HIGH (ref 0.1–1.0)
NEUTROS ABS: 19.6 10*3/uL — AB (ref 1.7–7.7)
NEUTROS PCT: 82 %
Platelets: 197 10*3/uL (ref 150–400)
RBC: 3.55 MIL/uL — ABNORMAL LOW (ref 4.22–5.81)
RDW: 15.3 % (ref 11.5–15.5)
WBC: 23.9 10*3/uL — ABNORMAL HIGH (ref 4.0–10.5)

## 2015-10-27 LAB — GLUCOSE, SEROUS FLUID: GLUCOSE FL: 101 mg/dL

## 2015-10-27 LAB — BODY FLUID CELL COUNT WITH DIFFERENTIAL
Eos, Fluid: 0 %
Lymphs, Fluid: 24 %
Monocyte-Macrophage-Serous Fluid: 10 % — ABNORMAL LOW (ref 50–90)
NEUTROPHIL FLUID: 66 % — AB (ref 0–25)
Total Nucleated Cell Count, Fluid: 33 cu mm (ref 0–1000)

## 2015-10-27 LAB — GRAM STAIN

## 2015-10-27 LAB — PROTIME-INR
INR: 1.74
PROTHROMBIN TIME: 20.6 s — AB (ref 11.4–15.2)

## 2015-10-27 LAB — AMMONIA: Ammonia: 44 umol/L — ABNORMAL HIGH (ref 9–35)

## 2015-10-27 LAB — PROTEIN, BODY FLUID

## 2015-10-27 LAB — LACTATE DEHYDROGENASE, PLEURAL OR PERITONEAL FLUID: LD, Fluid: 60 U/L — ABNORMAL HIGH (ref 3–23)

## 2015-10-27 LAB — LIPASE, BLOOD: Lipase: 60 U/L — ABNORMAL HIGH (ref 11–51)

## 2015-10-27 MED ORDER — DEXTROSE 5 % IV SOLN
1.0000 g | INTRAVENOUS | Status: DC
  Administered 2015-10-27: 1 g via INTRAVENOUS
  Filled 2015-10-27: qty 10

## 2015-10-27 MED ORDER — LIDOCAINE HCL (PF) 1 % IJ SOLN
INTRAMUSCULAR | Status: AC
  Filled 2015-10-27: qty 10

## 2015-10-27 NOTE — ED Notes (Signed)
Ashley PA at bedside

## 2015-10-27 NOTE — ED Provider Notes (Signed)
[facility] DEPT Provider Note   CSN: [id number] Arrival date & time: 10/27/15  0604     History   Chief Complaint Chief Complaint  Patient presents with  . Abdominal Pain    HPI Michael [name] is a 45 y.o. male.  Michael [name] is a 45 y.o. male with h/o hepatic cirrhosis and liver failure presents to ED with complaint of ascites. Pt diagnosed with alcoholic liver cirrhosis in May 2017 and since diagnosis has had 6 paracentesis for ascites. Patient was recently admitted on 10/18/15 for acute on chronic renal failure most likely secondary to voluntary fluid restriction.  He underwent paracentesis during his hospital stay and approximately 3L were removed, no evidence of SBP. He was subsequently transferred to [facility] for further evaluation and management. Pt reports onset of ascites shortly after paracentesis and presents today with abdominal pain and distension. He reports last alcohol use was May 2017. He is compliant with his medications. Endorses associated abdominal pain, shortness of breath, lower extremity edema, and rectal pain secondary to his hemorrhoids. Denies fever, chills, diaphoresis, chest pain, dysuria, hematuria, nausea, vomiting. In the past he has been followed by Dr. Oletta Lamas of [facility]; however, reports he is now a patient of Dr. Drue Novel of [facility]. He is scheduled for an appointment with her tomorrow.       Past Medical History:  Diagnosis Date  . Anxiety   . Ascites   . Cirrhosis of liver [facility])     Patient Active Problem List   Diagnosis Date Noted  . Liver disease, chronic, with cirrhosis ([place])   . Muscle weakness (generalized)   . Hypotension   . AKI (acute kidney injury) ([place]) 10/18/2015  . Multiple lung nodules on CT 09/02/2015  . Oral thrush 09/02/2015  . Dyspnea and respiratory abnormality 09/02/2015  . Liver failure without hepatic coma ([place])   . Liver failure ([place]) 06/17/2015  . Alcoholic cirrhosis of liver with ascites ([place]) 06/17/2015  . Acute  alcoholic hepatitis 43/32/9518  . Coagulopathy ([place]) 06/17/2015  . Macrocytic anemia 06/17/2015  . 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 Ascites 06/17/2015    Past Surgical History:  Procedure Laterality Date  . NO PAST SURGERIES     SURGERY AS INFANT PYLORIC STENOSIS  . OPEN REDUCTION INTERNAL FIXATION (ORIF) FOOT LISFRANC FRACTURE Right 08/06/2014   Procedure: RIGHT FOOT FRACTURE AND LISFRANC LIGAMENT INJURY FIXATION AND SUTURE REMOVAL LEFT WRIST INCISION;  Surgeon: Newt Minion, MD;  Location: [facility];  Service: Orthopedics;  Laterality: Right;  . ORIF WRIST FRACTURE Left 07/20/2014   Procedure: OPEN REDUCTION INTERNAL FIXATION (ORIF) WRIST FRACTURE;  Surgeon: Meredith Pel, MD;  Location: [facility];  Service: Orthopedics;  Laterality: Left;  . PARACENTESIS    . SMALL INTESTINE SURGERY     as an infant       Home Medications    Prior to Admission medications   Medication Sig Start Date End Date Taking? Authorizing Provider  busPIRone (BUSPAR) 7.5 MG tablet Take 1 tablet (7.5 mg total) by mouth 2 (two) times daily. 10/22/15  Yes Sela Hilding, MD  lactulose (CHRONULAC) 10 GM/15ML solution Take 30 mLs (20 g total) by mouth 2 (two) times daily. Patient taking differently: Take 10 g by mouth 2 (two) times daily.  10/22/15  Yes Sela Hilding, MD  nystatin (MYCOSTATIN) 100000 UNIT/ML suspension Take 5 mLs (500,000 Units total) by mouth 4 (four) times daily. 10/22/15  Yes Sela Hilding, MD  pantoprazole (PROTONIX) 40 MG tablet Take 1 tablet (  40 mg total) by mouth daily. 10/23/15  Yes Sela Hilding, MD  predniSONE (DELTASONE) 10 MG tablet Take 5 mg by mouth daily with breakfast.    Yes Historical Provider, MD  cefTRIAXone 1 g in dextrose 5 % 50 mL Inject 1 g into the vein daily. Patient not taking: Reported on 10/27/2015 10/23/15   Sela Hilding, MD  midodrine (PROAMATINE) 2.5 MG tablet Take 5 tablets (12.5 mg total) by mouth 3 (three) times daily with meals. Patient  not taking: Reported on 10/27/2015 10/22/15   Sela Hilding, MD  mupirocin ointment (BACTROBAN) 2 % Place 1 application into the nose 2 (two) times daily. Patient not taking: Reported on 10/27/2015 10/22/15   Sela Hilding, MD  octreotide 500 mcg in sodium chloride 0.9 % 250 mL Inject 50 mcg/hr into the vein continuous. Patient not taking: Reported on 10/27/2015 10/22/15   Sela Hilding, MD  rifaximin (XIFAXAN) 550 MG TABS tablet Take 1 tablet (550 mg total) by mouth 2 (two) times daily. Patient not taking: Reported on 10/27/2015 10/22/15   Sela Hilding, MD  tamsulosin (FLOMAX) 0.4 MG CAPS capsule Take 1 capsule (0.4 mg total) by mouth daily after supper. Patient not taking: Reported on 10/27/2015 10/22/15   Sela Hilding, MD    Family History Family History  Problem Relation Age of Onset  . Prostate cancer Father   . Diabetes Mellitus II Paternal Grandfather     Social History Social History  Substance Use Topics  . Smoking status: Former Smoker    Packs/day: 1.00    Years: 18.00    Types: Cigarettes    Quit date: 06/11/2015  . Smokeless tobacco: Never Used  . Alcohol use No     Comment: stoped in May 2017      Allergies   Review of patient's allergies indicates no known allergies.   Review of Systems Review of Systems  Constitutional: Negative for chills, diaphoresis and fever.  HENT: Negative for trouble swallowing.   Eyes: Negative for visual disturbance.  Respiratory: Positive for shortness of breath ( secondary to ascites).   Cardiovascular: Negative for chest pain.  Gastrointestinal: Positive for abdominal distention. Negative for abdominal pain, nausea and vomiting.  Genitourinary: Negative for dysuria and hematuria.  Musculoskeletal: Negative for arthralgias.  Skin: Positive for rash.  Neurological: Negative for syncope.     Physical Exam Updated Vital Signs BP 106/77   Pulse 113   Temp 98.1 F (36.7 C) (Oral)   Resp 18   Ht _0   (1.727 m)   Wt 65.8 kg   SpO2 98%   BMI 22.05 kg/m   Physical Exam  Constitutional: He appears well-developed and well-nourished. No distress.  HENT:  Head: Normocephalic and atraumatic.  Mouth/Throat: Oropharynx is clear and moist. No oropharyngeal exudate.  Eyes: Conjunctivae and EOM are normal. Pupils are equal, round, and reactive to light. Right eye exhibits no discharge. Left eye exhibits no discharge. Scleral icterus ( mild) is present.  Neck: Normal range of motion and phonation normal. Neck supple. No neck rigidity. Normal range of motion present.  Cardiovascular: Normal rate, regular rhythm, normal heart sounds and intact distal pulses.   No murmur heard. 3+ b.l lower extremity pitting edema.   Pulmonary/Chest: Effort normal and breath sounds normal. No stridor. No respiratory distress. He has no wheezes. He has no rales.  Abdominal: Soft. Bowel sounds are normal. He exhibits distension and ascites. There is no tenderness. There is no rigidity, no rebound, no guarding and no CVA tenderness.  Abdomen is distended and taut. No erythema. No tenderness. Positive bowel sounds.   Musculoskeletal: Normal range of motion.  Lymphadenopathy:    He has no cervical adenopathy.  Neurological: He is alert. He is not disoriented. Coordination and gait normal. GCS eye subscore is 4. GCS verbal subscore is 5. GCS motor subscore is 6.  Skin: Skin is warm and dry. He is not diaphoretic.  Psychiatric: He has a normal mood and affect. His behavior is normal.     ED Treatments / Results  Labs (all labs ordered are listed, but only abnormal results are displayed) Labs Reviewed  COMPREHENSIVE METABOLIC PANEL - Abnormal; Notable for the following:       Result Value   Sodium 128 (*)    CO2 15 (*)    BUN 34 (*)    Creatinine, Ser 1.89 (*)    Calcium 8.8 (*)    Total Protein 5.3 (*)    Albumin 2.9 (*)    AST 57 (*)    Alkaline Phosphatase 264 (*)    Total Bilirubin 3.3 (*)    GFR calc non  Af Amer 41 (*)    GFR calc Af Amer 48 (*)    All other components within normal limits  LIPASE, BLOOD - Abnormal; Notable for the following:    Lipase 60 (*)    All other components within normal limits  CBC WITH DIFFERENTIAL/PLATELET - Abnormal; Notable for the following:    WBC 23.9 (*)    RBC 3.55 (*)    Hemoglobin 11.7 (*)    HCT 34.9 (*)    Neutro Abs 19.6 (*)    Monocytes Absolute 1.4 (*)    All other components within normal limits  URINALYSIS, ROUTINE W REFLEX MICROSCOPIC (NOT AT [facility]) - Abnormal; Notable for the following:    Color, Urine AMBER (*)    All other components within normal limits  PROTIME-INR - Abnormal; Notable for the following:    Prothrombin Time 20.6 (*)    All other components within normal limits  AMMONIA - Abnormal; Notable for the following:    Ammonia 44 (*)    All other components within normal limits  BODY FLUID CELL COUNT WITH DIFFERENTIAL - Abnormal; Notable for the following:    Neutrophil Count, Fluid 66 (*)    Monocyte-Macrophage-Serous Fluid 10 (*)    All other components within normal limits  LACTATE DEHYDROGENASE, BODY FLUID - Abnormal; Notable for the following:    LD, Fluid 60 (*)    All other components within normal limits  GRAM STAIN  CULTURE, BODY FLUID-BOTTLE  GLUCOSE, SEROUS FLUID  PROTEIN, BODY FLUID    EKG  EKG Interpretation None       Radiology US Paracentesis  Result Date: 10/27/2015 INDICATION: Abdominal distention secondary to ascites. History of cirrhosis and chronic kidney disease. Request therapeutic paracentesis. EXAM: ULTRASOUND GUIDED RIGHT LOWER QUADRANT PARACENTESIS MEDICATIONS: None. COMPLICATIONS: None immediate. PROCEDURE: Informed written consent was obtained from the patient after a discussion of the risks, benefits and alternatives to treatment. A timeout was performed prior to the initiation of the procedure. Initial ultrasound scanning demonstrates a large amount of ascites within the right lower  abdominal quadrant. The right lower abdomen was prepped and draped in the usual sterile fashion. 1% lidocaine with epinephrine was used for local anesthesia. Following this, a 19 gauge, 7-cm, Yueh catheter was introduced. An ultrasound image was saved for documentation purposes. The paracentesis was performed. The catheter was removed and a dressing was  applied. The patient tolerated the procedure well without immediate post procedural complication. FINDINGS: A total of approximately 5 L of clear yellow fluid was removed. IMPRESSION: Successful ultrasound-guided paracentesis yielding 5 liters of peritoneal fluid. Read by: Ascencion Dike PA-C Electronically Signed   By: Corrie Mckusick D.O.   On: 10/27/2015 13:52    Procedures .Paracentesis Date/Time: 10/27/2015 11:41 AM Performed by: Roxanna Mew Authorized by: Roxanna Mew   Consent:    Consent obtained:  Verbal   Consent given by:  Patient   Risks discussed:  Bleeding, infection and bowel perforation   Alternatives discussed:  No treatment Pre-procedure details:    Procedure purpose:  Diagnostic Anesthesia (see MAR for exact dosages):    Anesthesia method:  None Procedure details:    Needle gauge: 21.   Ultrasound guidance: yes     Puncture site:  R lower quadrant   Fluid removed amount:  20cc   Fluid appearance:  Yellow   Dressing:  Adhesive bandage Post-procedure details:    Patient tolerance of procedure:  Tolerated well, no immediate complications   (including critical care time)  Medications Ordered in ED Medications  cefTRIAXone (ROCEPHIN) 1 g in dextrose 5 % 50 mL IVPB (0 g Intravenous Stopped 10/27/15 1211)  lidocaine (PF) (XYLOCAINE) 1 % injection (not administered)     Initial Impression / Assessment and Plan / ED Course  I have reviewed the triage vital signs and the nursing notes.  Pertinent labs & imaging results that were available during my care of the patient were reviewed by me and considered in  my medical decision making (see chart for details).  Clinical Course    Patient presents to ED with complaint of abdominal distension and ascited. Patient is afebrile and non-toxic appearing in NAD. Vital signs remarkable for tachycardia. Abdomen is distended and taut. No erythema noted. No abdominal tenderness. Positive bowel sounds. B/l 3+ pitting lower extremity edema. Will check basic labs, ammonia. Suspect patient will need admission for paracentesis.   Ammonia mildly elevated; however, improved from previous. PT elevated, again improved from previous. Lipase mildly elevated, but improved. AKI present; hyponatremia; bicarb low. AST increased; alk phos increased from previous. Increased leuokocytosis, U/A negative for infection. Unclear source of leukocytosis, low suspicion for SBP. Anemia stable. Will consult hospitalist for admission for AKI, likely in the setting of hepatorenal syndrome. Pt will likely need therapeutic paracentesis. Discussed patient with Dr. Laneta Simmers who also saw patient, agrees with plan.   Spoke with Dr. Lincoln Brigham, greatly appreciate her time and input, recommend transfer to [facility] considering pt's complex history and his GI provider, Dr. Drue Novel, is at [facility]   10:45AM: Spoke with transfer service at [facility]. Unfortunately, only able to do ED to ED transfer; however, [facility] under diversion, only accepting traumas. Transfer would need to be internal medicine to internal medicine; however, there is a couple day delay due to beds. Will consult Dr. Lincoln Brigham for admission.   10:55AM: Spoke with Dr. Lincoln Brigham, agree to admission for further management of AKI and ascites. Requesting diagnostic paracentesis and start on cephalosporin for possible SBP. Under direction of Dr. Laneta Simmers, diagnostic paracentesis completed. Pt tolerated procedure well. No acute complications.   3:35 PM: patient is requesting to leave. Discussed with patient the risks of leaving without proper treatment to include worsening condition  and even death. He is alert and of sound mind. Patient understands the risks and recognizes that he is leaving against medical advice. Patient to leave against medical advice.  Final Clinical Impressions(s) / ED Diagnoses   Final diagnoses:  Ascites  Hepatorenal syndrome [facility])    New Prescriptions New Prescriptions   No medications on file     Roxanna Mew, PA-C 10/27/15 [address] [place], PA-C 10/27/15 1537    Leo Grosser, MD 10/28/15 1810    Leo Grosser, MD 10/28/15 [phone]

## 2015-10-27 NOTE — ED Triage Notes (Signed)
Pt arrives to A01 at this time. Pt states that he has had increased abdominal distension and pain for the last "several days".  Pt states "I need to get this fluid drained off my stomach."  Pt rates his pain at a 7/10.  Pt's abdomen is noted to be tight to palpation and distended.   Chief Complaint  Patient presents with  . Abdominal Pain   Past Medical History:  Diagnosis Date  . Anxiety   . Ascites   . Cirrhosis of liver ([facility])

## 2015-10-27 NOTE — H&P (Signed)
Family Medicine Teaching [facility] Admission History and Physical Service Pager: [phone]  Patient name: Michael [name] Medical record number: [id number] Date of birth: [birth year] Age: 45 y.o. Gender: male  Primary Care Provider: Denny Levy, MD Consultants: none Code Status: Full  Chief Complaint: abdominal distrension  Assessment and Plan: [name] is a 45 y.o. male presenting with abdominal distension. PMH is significant for alcoholic cirrhosis diagnosed in 05/2015, anxiety  Alcoholic Cirrhosis- Gross ascites on exam with clinical evidence of SBP on exam afebrile, but WBC elevated to 23. MELD score 28 with 19% 3 month mortality. 3L peritoneal drained 6 days ago. Ammonia 44 from 91 on 9/22 . He is followed by Dr Piedad Climes at [facility] and has an appointment with her tomorrow for further work up for worsening hepatic status. At this time he is overall clinically and hemodynamically stable - Admit to teaching service Dr Lum Babe attending - will continue home lactulose, rifaximin - started on prednisone 5 mg qD at [facility] hospitalization with last dose on 10/2, will continue here - IR paracentesis - will follow peritoneal fluid studies done in the ED - will start rocephin empirically given high WBC and great risk for developing SBP - will plan to transfer to [facility] GI when they have bed availabilities - continue thiamin/folic acid   Hepatorenal- Cr 1.89 from 1.07 on transfer at last admission. Concern for some element of hepatorenal syndrome.  - will continue octreotide and midodrine  - follow renal function - consider renal consult of renal function worsens  Skin Abrasion- noted on exam, appears stable and no evidence of infection - consult wound care if worsens  Hypotension/tachycardia- Stable. Noted on last admission and treated with octreotide and midodrine. Likely due to sequela of low albumin from end stage hepatic disease - will continue midodrine, octreotide - continue on  telemetry  Oral thrush- Improving - continue oral nystatin  Anxiety- Stable - continue buspar  FEN/GI: regular diet, SLIV Prophylaxis: heparin, his INR of elevated to 1.74 due to hepatic disfunction, however there is concern for still risk of DVT given lack of uniform coagulation factor suppression  Disposition: pending clinical stability  History of Present Illness:  Michael [name] is a 45 y.o. male presenting with worsening abdominal distension and abdominal discomfort since transfer from the family medicine service. He was transferred to the [facility] GI service for further work up and has since established GI care with Dr. Piedad Climes of [facility]. He is scheduled for endoscopy tomorrow. He denies fevers, nausea , emesis. He does report difficulty breathing since his abdomen is distended.  Review Of Systems: Per HPI with the following additions:otherwise denies recent febrile illness, abdominal tenderness, cough, headache, changes in vision   ROS  Patient Active Problem List   Diagnosis Date Noted  . Liver disease, chronic, with cirrhosis (HCC)   . Muscle weakness (generalized)   . Hypotension   . AKI (acute kidney injury) (HCC) 10/18/2015  . Multiple lung nodules on CT 09/02/2015  . Oral thrush 09/02/2015  . Dyspnea and respiratory abnormality 09/02/2015  . Liver failure without hepatic coma (HCC)   . Liver failure (HCC) 06/17/2015  . Alcoholic cirrhosis of liver with ascites (HCC) 06/17/2015  . Acute alcoholic hepatitis 06/17/2015  . Coagulopathy (HCC) 06/17/2015  . Macrocytic anemia 06/17/2015  . Hepatorenal syndrome (HCC) 06/17/2015  . Ascites 06/17/2015    Past Medical History: Past Medical History:  Diagnosis Date  . Anxiety   . Ascites   . Cirrhosis of liver (HCC)  Past Surgical History: Past Surgical History:  Procedure Laterality Date  . NO PAST SURGERIES     SURGERY AS INFANT PYLORIC STENOSIS  . OPEN REDUCTION INTERNAL FIXATION (ORIF) FOOT LISFRANC FRACTURE Right  08/06/2014   Procedure: RIGHT FOOT FRACTURE AND LISFRANC LIGAMENT INJURY FIXATION AND SUTURE REMOVAL LEFT WRIST INCISION;  Surgeon: Nadara Mustard, MD;  Location: [facility] OR;  Service: Orthopedics;  Laterality: Right;  . ORIF WRIST FRACTURE Left 07/20/2014   Procedure: OPEN REDUCTION INTERNAL FIXATION (ORIF) WRIST FRACTURE;  Surgeon: Cammy Copa, MD;  Location: [facility] OR;  Service: Orthopedics;  Laterality: Left;  . PARACENTESIS    . SMALL INTESTINE SURGERY     as an infant    Social History: Social History  Substance Use Topics  . Smoking status: Former Smoker    Packs/day: 1.00    Years: 18.00    Types: Cigarettes    Quit date: 06/11/2015  . Smokeless tobacco: Never Used  . Alcohol use No     Comment: stoped in May 2017    Additional social history: denies current etoh, cigarette use, drug use Please also refer to relevant sections of EMR.  Family History: Family History  Problem Relation Age of Onset  . Prostate cancer Father   . Diabetes Mellitus II Paternal Grandfather     Allergies and Medications: No Known Allergies No current facility-administered medications on file prior to encounter.    Current Outpatient Prescriptions on File Prior to Encounter  Medication Sig Dispense Refill  . busPIRone (BUSPAR) 7.5 MG tablet Take 1 tablet (7.5 mg total) by mouth 2 (two) times daily.    Marland Kitchen lactulose (CHRONULAC) 10 GM/15ML solution Take 30 mLs (20 g total) by mouth 2 (two) times daily. (Patient taking differently: Take 10 g by mouth 2 (two) times daily. ) 240 mL 0  . nystatin (MYCOSTATIN) 100000 UNIT/ML suspension Take 5 mLs (500,000 Units total) by mouth 4 (four) times daily. 60 mL 0  . pantoprazole (PROTONIX) 40 MG tablet Take 1 tablet (40 mg total) by mouth daily.    . predniSONE (DELTASONE) 10 MG tablet Take 5 mg by mouth daily with breakfast.     . cefTRIAXone 1 g in dextrose 5 % 50 mL Inject 1 g into the vein daily. (Patient not taking: Reported on 10/27/2015)    . midodrine  (PROAMATINE) 2.5 MG tablet Take 5 tablets (12.5 mg total) by mouth 3 (three) times daily with meals. (Patient not taking: Reported on 10/27/2015)    . mupirocin ointment (BACTROBAN) 2 % Place 1 application into the nose 2 (two) times daily. (Patient not taking: Reported on 10/27/2015) 22 g 0  . octreotide 500 mcg in sodium chloride 0.9 % 250 mL Inject 50 mcg/hr into the vein continuous. (Patient not taking: Reported on 10/27/2015)    . rifaximin (XIFAXAN) 550 MG TABS tablet Take 1 tablet (550 mg total) by mouth 2 (two) times daily. (Patient not taking: Reported on 10/27/2015) 42 tablet   . tamsulosin (FLOMAX) 0.4 MG CAPS capsule Take 1 capsule (0.4 mg total) by mouth daily after supper. (Patient not taking: Reported on 10/27/2015) 30 capsule     Objective: BP 120/89   Pulse 108   Temp 98.1 F (36.7 C) (Oral)   Resp 17   Ht 5\' 8"  (1.727 m)   Wt 145 lb (65.8 kg)   SpO2 99%   BMI 22.05 kg/m  Exam: General: mild distress, crying occasionally when told he may be admissted Eyes: EOMI, PERRL ENTM:  MMM, no oral rash noted Neck: supple Cardiovascular: tachycardic, regular rhythm, + LE pulses bilaterally Respiratory: CTAB, normal respiratory effort Gastrointestinal: + BS, distended abdomen, non tender, no rebound tenderness or guarding MSK: bilateral LE 2+ pitting edema Derm: scattered bruises noted diffusely, area of skin abrasion over left forearm, hemostatic Neuro: AOx3 Psych: intermittently crying as above, else appropriate mood and affect  Labs and Imaging: CBC BMET   Recent Labs Lab 10/27/15 0704  WBC 23.9*  HGB 11.7*  HCT 34.9*  PLT 197    Recent Labs Lab 10/27/15 0704  NA 128*  K 4.4  CL 104  CO2 15*  BUN 34*  CREATININE 1.89*  GLUCOSE 92  CALCIUM 8.8*      Bonney AidAlyssa A Mahsa Hanser, MD 10/27/2015, 11:18 AM PGY-3, [facility] FPTS Intern pager: [phone], text pages welcome

## 2015-10-27 NOTE — Discharge Instructions (Signed)
Read the information below.  You are leaving against medical advice. You verbalized your understanding that your condition could worsen and result in death and request to leave.  Please be sure to call your GI provided ASAP for follow up.  You may return to the Emergency Department at any time for worsening condition or any new symptoms that concern you.

## 2015-10-27 NOTE — ED Notes (Signed)
Pt states that "I feel a lot better, I can breathe better, my stomach doesn't hurt like it did, I wanna leave, I wanna go my sons football game". This RN informed the pt about his bed upstairs and that it is strongly advised that he stay.

## 2015-10-27 NOTE — Procedures (Signed)
Successful US guided paracentesis from RLQ.  Yielded 5L of clear yellow fluid.  No immediate complications.  Pt tolerated well.   Specimen was not sent for labs.  Brayton ElBRUNING, Kohei Antonellis PA-C 10/27/2015 1:49 PM

## 2015-10-27 NOTE — ED Notes (Signed)
MD at bedside. 

## 2015-10-27 NOTE — ED Triage Notes (Signed)
Pt. reports low blood pressure this evening at home 85/50 , alert and oriented at arrival , denies pain or discomfort , respirations unlabored . Pt. reported paracentesis today .

## 2015-10-27 NOTE — ED Notes (Addendum)
Dr. Eniola at bedside 

## 2015-10-27 NOTE — ED Notes (Signed)
Dr.Knott at bedside. 

## 2015-10-27 NOTE — Progress Notes (Addendum)
[facility] Progress [facility]  Para [name] 45 y.o. 1970-04-04  CC:  Ascites, acute kidney injury,  cirrhosis  HPI:  Patient is known to GI from recent admission for decompensated alcoholic cirrhosis and acute kidney injury .Patient was admitted with acute kidney injury and possible hepatorenal syndrome. Patient was treated with the octreotide, midodrine and IV albumin. Patient was also seen by nephrology. His kidney injury was thought to be prerenal from diuretic use. His kidney function improved. Octreotide and albumin were discontinued. Patient did underwent paracentesis which was negative for SBP. Patient was subsequently  [facility]. Patient was managed with the midodrin  and octreotide and was subsequently discharged. Patient did complain of some bright blood on the tissue paper and had a drop in hemoglobin so he was scheduled [facility]. Was discharged on 5 milligram of prednisone for a week. Was seen by Dr. Randa EvensEdwards yesterday.  Came in to ED today because he started felling SOB from abdominal distention. Patient underwent paracentesis with 5 L of fluid removal. Negative for SBP. Patient is feeling better and he wants to go home.  Advised him about worsening Kidney function and to stay in hospital for further workup. Patient declined admission and wants to go home AMA.    Objective: Vital signs in last 24 hours: Vitals:   10/27/15 1338 10/27/15 1349  BP: 109/83 129/82  Pulse:    Resp:    Temp:      Physical Exam:  General:  Alert, cooperative but wants to go home AMA   Head:  Normocephalic, without obvious abnormality, atraumatic  Eyes:  , EOM's intact, Scleral icterus noted   Lungs:   Fine basilar crackles   Heart:  Regular rate and rhythm, S1, S2 normal  Abdomen:   Distended with ascites , mild left upper quadrant discomfort , BS present   Extremities: 1 + edema        Lab Results:  Recent Labs  10/27/15 0704  NA  128*  K 4.4  CL 104  CO2 15*  GLUCOSE 92  BUN 34*  CREATININE 1.89*  CALCIUM 8.8*    Recent Labs  10/27/15 0704  AST 57*  ALT 35  ALKPHOS 264*  BILITOT 3.3*  PROT 5.3*  ALBUMIN 2.9*    Recent Labs  10/27/15 0704  WBC 23.9*  NEUTROABS 19.6*  HGB 11.7*  HCT 34.9*  MCV 98.3  PLT 197    Recent Labs  10/27/15 0735  LABPROT 20.6*  INR 1.74      Assessment/Plan: - Acute kidney injury in setting of diuretics use for ascites.Most likely pre renal. Cr again elevated at 1.89 today - Refractory Ascites. S/P multiple Paracentesis. Last one today. Negative for SBP - Anemia. [facility]  - Alcoholic cirrhosis. Decompensated. - Abnormal LFTs. From  decompensated liver disease - History of hepatic encephalopathy. A/O x3 now  - Hypotension  Recommendations -------------------------- - Patient wants to go home AMA as he is feeling better after paracentesis.  - Advised to stop diuretics and have repeat blood work done in 1 week.  - [facility] and get [facility] with [facility]ology.  - Patient will need evaluation for liver transplant .  - F/up with Dr. Randa EvensEdwards as scheduled.    Kathi DerParag Filomeno Cromley MD, FACP 10/27/2015, 1:50 PM  Pager [phone]  If no answer or after 5 PM call [phone]

## 2015-10-27 NOTE — ED Notes (Signed)
Provider at bedside

## 2015-10-27 NOTE — ED Notes (Signed)
Patient transported to Ultrasound 

## 2015-10-27 NOTE — ED Notes (Signed)
Morrie Sheldonshley PA stated that blood cultures were not needed at this time.

## 2015-10-28 ENCOUNTER — Emergency Department (HOSPITAL_COMMUNITY)
Admission: EM | Admit: 2015-10-28 | Discharge: 2015-10-28 | Disposition: A | Discharge status: Home / Self Care | Payer: Medicaid Other | Source: Home / Self Care | Attending: Emergency Medicine | Admitting: Emergency Medicine

## 2015-10-28 DIAGNOSIS — I9589 Other hypotension: Secondary | ICD-10-CM

## 2015-10-28 DIAGNOSIS — K7210 Chronic hepatic failure without coma: Secondary | ICD-10-CM

## 2015-10-28 LAB — COMPREHENSIVE METABOLIC PANEL
ALBUMIN: 2.8 g/dL — AB (ref 3.5–5.0)
ALK PHOS: 274 U/L — AB (ref 38–126)
ALT: 35 U/L (ref 17–63)
ANION GAP: 10 (ref 5–15)
AST: 61 U/L — ABNORMAL HIGH (ref 15–41)
BILIRUBIN TOTAL: 3.7 mg/dL — AB (ref 0.3–1.2)
BUN: 31 mg/dL — ABNORMAL HIGH (ref 6–20)
CALCIUM: 8.6 mg/dL — AB (ref 8.9–10.3)
CO2: 13 mmol/L — ABNORMAL LOW (ref 22–32)
Chloride: 104 mmol/L (ref 101–111)
Creatinine, Ser: 1.61 mg/dL — ABNORMAL HIGH (ref 0.61–1.24)
GFR calc Af Amer: 58 mL/min — ABNORMAL LOW (ref >60)
GFR calc non Af Amer: 50 mL/min — ABNORMAL LOW (ref >60)
GLUCOSE: 72 mg/dL (ref 65–99)
Potassium: 4.6 mmol/L (ref 3.5–5.1)
Sodium: 127 mmol/L — ABNORMAL LOW (ref 135–145)
TOTAL PROTEIN: 5.3 g/dL — AB (ref 6.5–8.1)

## 2015-10-28 LAB — CBC WITH DIFFERENTIAL/PLATELET
Basophils Absolute: 0 10*3/uL (ref 0.0–0.1)
Basophils Relative: 0 %
EOS PCT: 0 %
Eosinophils Absolute: 0 10*3/uL (ref 0.0–0.7)
HEMATOCRIT: 34.1 % — AB (ref 39.0–52.0)
HEMOGLOBIN: 11.7 g/dL — AB (ref 13.0–17.0)
LYMPHS PCT: 12 %
Lymphs Abs: 2.8 10*3/uL (ref 0.7–4.0)
MCH: 33.3 pg (ref 26.0–34.0)
MCHC: 34.3 g/dL (ref 30.0–36.0)
MCV: 97.2 fL (ref 78.0–100.0)
MONOS PCT: 6 %
Monocytes Absolute: 1.4 10*3/uL — ABNORMAL HIGH (ref 0.1–1.0)
NEUTROS ABS: 19.5 10*3/uL — AB (ref 1.7–7.7)
Neutrophils Relative %: 82 %
Platelets: 218 10*3/uL (ref 150–400)
RBC: 3.51 MIL/uL — ABNORMAL LOW (ref 4.22–5.81)
RDW: 15.7 % — ABNORMAL HIGH (ref 11.5–15.5)
WBC: 23.7 10*3/uL — ABNORMAL HIGH (ref 4.0–10.5)

## 2015-10-28 LAB — PATHOLOGIST SMEAR REVIEW

## 2015-10-28 NOTE — ED Notes (Signed)
Pt brought to B16 via wheelchair

## 2015-10-28 NOTE — ED Provider Notes (Signed)
[facility]-EMERGENCY DEPT Provider Note   CSN: [id number] Arrival date & time: 10/27/15  2335  By signing my name below, I, Majel Homer, attest that this documentation has been prepared under the direction and in the presence of Arby Barrette, MD . Electronically Signed: Majel Homer, Scribe. 10/28/2015. 3:31 AM.  History   Chief Complaint Chief Complaint  Patient presents with  . Hypotension   The history is provided by the patient. No language interpreter was used.   HPI Comments: Gerrod Maule is a 45 y.o. male with PMHx of ascites and cirrhosis of his liver, who presents to the Emergency Department for an evaluation of low blood pressure that began yesterday evening. Pt reports he had to leave his son's football game early yesterday evening due to "feeling bad." He states his fiance checked his blood pressure when he arrived home and it read 97/79. Pt notes he visited the ED this morining because of pressure from his family to get evaluated but denies pain anywhere in his body. He denies lightheadedness, numbness and weakness in his extremities. Pt notes he visited [facility] ED yesterday morning and had paracentesis performed with no complications.   Past Medical History:  Diagnosis Date  . Anxiety   . Ascites   . Cirrhosis of liver [facility])     Patient Active Problem List   Diagnosis Date Noted  . Liver disease, chronic, with cirrhosis (HCC)   . Muscle weakness (generalized)   . Hypotension   . AKI (acute kidney injury) (HCC) 10/18/2015  . Multiple lung nodules on CT 09/02/2015  . Oral thrush 09/02/2015  . Dyspnea and respiratory abnormality 09/02/2015  . Liver failure without hepatic coma (HCC)   . Liver failure (HCC) 06/17/2015  . Alcoholic cirrhosis of liver with ascites (HCC) 06/17/2015  . Acute alcoholic hepatitis 06/17/2015  . Coagulopathy (HCC) 06/17/2015  . Macrocytic anemia 06/17/2015  . Hepatorenal syndrome (HCC) 06/17/2015  . Ascites 06/17/2015    Past Surgical History:    Procedure Laterality Date  . NO PAST SURGERIES     SURGERY AS INFANT PYLORIC STENOSIS  . OPEN REDUCTION INTERNAL FIXATION (ORIF) FOOT LISFRANC FRACTURE Right 08/06/2014   Procedure: RIGHT FOOT FRACTURE AND LISFRANC LIGAMENT INJURY FIXATION AND SUTURE REMOVAL LEFT WRIST INCISION;  Surgeon: Nadara Mustard, MD;  Location: [facility] OR;  Service: Orthopedics;  Laterality: Right;  . ORIF WRIST FRACTURE Left 07/20/2014   Procedure: OPEN REDUCTION INTERNAL FIXATION (ORIF) WRIST FRACTURE;  Surgeon: Cammy Copa, MD;  Location: [facility] OR;  Service: Orthopedics;  Laterality: Left;  . PARACENTESIS    . SMALL INTESTINE SURGERY     as an infant    Home Medications    Prior to Admission medications   Medication Sig Start Date End Date Taking? Authorizing Provider  busPIRone (BUSPAR) 7.5 MG tablet Take 1 tablet (7.5 mg total) by mouth 2 (two) times daily. 10/22/15   Garth Bigness, MD  cefTRIAXone 1 g in dextrose 5 % 50 mL Inject 1 g into the vein daily. Patient not taking: Reported on 10/27/2015 10/23/15   Garth Bigness, MD  lactulose (CHRONULAC) 10 GM/15ML solution Take 30 mLs (20 g total) by mouth 2 (two) times daily. Patient taking differently: Take 10 g by mouth 2 (two) times daily.  10/22/15   Garth Bigness, MD  midodrine (PROAMATINE) 2.5 MG tablet Take 5 tablets (12.5 mg total) by mouth 3 (three) times daily with meals. Patient not taking: Reported on 10/27/2015 10/22/15   Garth Bigness, MD  mupirocin ointment (  BACTROBAN) 2 % Place 1 application into the nose 2 (two) times daily. Patient not taking: Reported on 10/27/2015 10/22/15   Garth BignessKathryn Timberlake, MD  nystatin (MYCOSTATIN) 100000 UNIT/ML suspension Take 5 mLs (500,000 Units total) by mouth 4 (four) times daily. 10/22/15   Garth BignessKathryn Timberlake, MD  octreotide 500 mcg in sodium chloride 0.9 % 250 mL Inject 50 mcg/hr into the vein continuous. Patient not taking: Reported on 10/27/2015 10/22/15   Garth BignessKathryn Timberlake, MD  pantoprazole (PROTONIX) 40  MG tablet Take 1 tablet (40 mg total) by mouth daily. 10/23/15   Garth BignessKathryn Timberlake, MD  predniSONE (DELTASONE) 10 MG tablet Take 5 mg by mouth daily with breakfast.     Historical Provider, MD  rifaximin (XIFAXAN) 550 MG TABS tablet Take 1 tablet (550 mg total) by mouth 2 (two) times daily. Patient not taking: Reported on 10/27/2015 10/22/15   Garth BignessKathryn Timberlake, MD  tamsulosin (FLOMAX) 0.4 MG CAPS capsule Take 1 capsule (0.4 mg total) by mouth daily after supper. Patient not taking: Reported on 10/27/2015 10/22/15   Garth BignessKathryn Timberlake, MD    Family History Family History  Problem Relation Age of Onset  . Prostate cancer Father   . Diabetes Mellitus II Paternal Grandfather     Social History Social History  Substance Use Topics  . Smoking status: Former Smoker    Packs/day: 1.00    Years: 18.00    Types: Cigarettes    Quit date: 06/11/2015  . Smokeless tobacco: Never Used  . Alcohol use No     Comment: stoped in May 2017      Allergies   Review of patient's allergies indicates no known allergies.   Review of Systems Review of Systems 10 systems reviewed and all are negative for acute change except as noted in the HPI.  Physical Exam Updated Vital Signs BP 97/74   Pulse 107   Temp 98.2 F (36.8 C) (Oral)   Resp 19   SpO2 100%   Physical Exam  Constitutional: He is oriented to person, place, and time. No distress.  Patient is alert. He is nontoxic. No respiratory distress. Patient has stigmata of chronic renal failure. I have seen the patient on several prior occasions. He is well in appearance today at baseline.  HENT:  Head: Normocephalic and atraumatic.  Mucous membranes are moist. Posterior oropharynx widely patent. Dentition excellent condition.  Eyes: EOM are normal. Pupils are equal, round, and reactive to light.  Scleral icterus.  Neck: Normal range of motion. Neck supple.  Cardiovascular: Regular rhythm, normal heart sounds and intact distal pulses.     Borderline tachycardia.  Pulmonary/Chest: Effort normal and breath sounds normal. No respiratory distress.  Abdominal: Soft. Bowel sounds are normal. He exhibits distension. There is no tenderness.  Patient has mild abdominal distention. He does not have any tense ascites. Abdomen is soft. Hepatic margin is palpable but nontender.  Musculoskeletal: Normal range of motion. He exhibits no edema.  Patient has 2-3+ pitting edema bilateral lower extremities. Skin is somewhat thin and there are several areas of ecchymosis and superficial abrasions. No appearance of active cellulitis. Bilateral upper extremities have skin thinning and senile ecchymosis.  Neurological: He is alert and oriented to person, place, and time. No cranial nerve deficit. He exhibits normal muscle tone.  Skin: Skin is warm and dry.  Psychiatric: He has a normal mood and affect. Judgment normal.  Nursing note and vitals reviewed.  ED Treatments / Results  Labs (all labs ordered are listed, but only abnormal  results are displayed) Labs Reviewed  CBC WITH DIFFERENTIAL/PLATELET - Abnormal; Notable for the following:       Result Value   WBC 23.7 (*)    RBC 3.51 (*)    Hemoglobin 11.7 (*)    HCT 34.1 (*)    RDW 15.7 (*)    Neutro Abs 19.5 (*)    Monocytes Absolute 1.4 (*)    All other components within normal limits  COMPREHENSIVE METABOLIC PANEL - Abnormal; Notable for the following:    Sodium 127 (*)    CO2 13 (*)    BUN 31 (*)    Creatinine, Ser 1.61 (*)    Calcium 8.6 (*)    Total Protein 5.3 (*)    Albumin 2.8 (*)    AST 61 (*)    Alkaline Phosphatase 274 (*)    Total Bilirubin 3.7 (*)    GFR calc non Af Amer 50 (*)    GFR calc Af Amer 58 (*)    All other components within normal limits    EKG  EKG Interpretation None       Radiology US Paracentesis  Result Date: 10/27/2015 INDICATION: Abdominal distention secondary to ascites. History of cirrhosis and chronic kidney disease. Request therapeutic  paracentesis. EXAM: ULTRASOUND GUIDED RIGHT LOWER QUADRANT PARACENTESIS MEDICATIONS: None. COMPLICATIONS: None immediate. PROCEDURE: Informed written consent was obtained from the patient after a discussion of the risks, benefits and alternatives to treatment. A timeout was performed prior to the initiation of the procedure. Initial ultrasound scanning demonstrates a large amount of ascites within the right lower abdominal quadrant. The right lower abdomen was prepped and draped in the usual sterile fashion. 1% lidocaine with epinephrine was used for local anesthesia. Following this, a 19 gauge, 7-cm, Yueh catheter was introduced. An ultrasound image was saved for documentation purposes. The paracentesis was performed. The catheter was removed and a dressing was applied. The patient tolerated the procedure well without immediate post procedural complication. FINDINGS: A total of approximately 5 L of clear yellow fluid was removed. IMPRESSION: Successful ultrasound-guided paracentesis yielding 5 liters of peritoneal fluid. Read by: Brayton El PA-C Electronically Signed   By: Gilmer Mor D.O.   On: 10/27/2015 13:52    Procedures Procedures (including critical care time)  Medications Ordered in ED Medications - No data to display  DIAGNOSTIC STUDIES:  Oxygen Saturation is 100% on RA, normal by my interpretation.    COORDINATION OF CARE:  3:28 AM Discussed treatment plan with pt at bedside and pt agreed to plan.  Initial Impression / Assessment and Plan / ED Course  I have reviewed the triage vital signs and the nursing notes.  Pertinent labs & imaging results that were available during my care of the patient were reviewed by me and considered in my medical decision making (see chart for details).  Clinical Course    I personally performed the services described in this documentation, which was scribed in my presence. The recorded information has been reviewed and is accurate.   Final  Clinical Impressions(s) / ED Diagnoses   Final diagnoses:  Chronic liver failure without hepatic coma (HCC)  Other specified hypotension   Patient is clinically at baseline. He is alert. He reports he feels very well. He is not symptomatic with lightheadedness, malaise. He is not having any fevers. Patient at baseline has severe chronic illness. Patient has had chronic leukocytosis. Upon prior visits he is frequently hypotensive. At this time, without any clinical findings such as fever, malaise,  nausea, vomiting or pain. I do feel patient is stable to continue his outpatient care. Patient is aware of signs and symptoms for which to return. New Prescriptions New Prescriptions   No medications on file     Arby Barrette, MD 10/28/15 [phone]

## 2015-11-01 LAB — CULTURE, BODY FLUID W GRAM STAIN -BOTTLE: Culture: NO GROWTH

## 2015-11-04 ENCOUNTER — Other Ambulatory Visit: Payer: Self-pay

## 2015-11-08 ENCOUNTER — Ambulatory Visit (INDEPENDENT_AMBULATORY_CARE_PROVIDER_SITE_OTHER): Payer: Medicaid Other

## 2015-11-08 VITALS — BP 79/46 | HR 109 | Temp 97.5°F | Resp 18 | Ht 68.5 in

## 2015-11-08 DIAGNOSIS — Z131 Encounter for screening for diabetes mellitus: Secondary | ICD-10-CM

## 2015-11-08 DIAGNOSIS — Z23 Encounter for immunization: Secondary | ICD-10-CM

## 2015-11-08 DIAGNOSIS — Z13220 Encounter for screening for lipoid disorders: Secondary | ICD-10-CM

## 2015-11-08 MED ORDER — PNEUMOCOCCAL VAC POLYVALENT 25 MCG/0.5ML IJ INJ: 0.5000 mL | INJECTION | INTRAMUSCULAR | Status: DC

## 2015-11-09 ENCOUNTER — Ambulatory Visit: Payer: Self-pay | Admitting: Internal Medicine

## 2015-11-11 ENCOUNTER — Ambulatory Visit: Payer: Self-pay | Admitting: Internal Medicine

## 2015-11-22 ENCOUNTER — Telehealth: Payer: Self-pay

## 2015-11-22 MED ORDER — BUSPIRONE HCL 7.5 MG PO TABS: 7.5000 mg | ORAL_TABLET | Freq: Two times a day (BID) | ORAL | 2 refills | Status: DC

## 2015-11-22 NOTE — Telephone Encounter (Signed)
Refill for buspirone sent into pharmacy. Thanks!  

## 2015-11-23 ENCOUNTER — Other Ambulatory Visit: Payer: Self-pay | Admitting: Family Medicine

## 2015-11-23 MED ORDER — BUSPIRONE HCL 5 MG PO TABS: 5.0000 mg | ORAL_TABLET | Freq: Two times a day (BID) | ORAL | 1 refills | Status: AC

## 2015-12-08 ENCOUNTER — Other Ambulatory Visit: Payer: Self-pay | Admitting: Family Medicine

## 2015-12-21 NOTE — Telephone Encounter (Signed)
ERROR

## 2015-12-30 DEATH — deceased

## 2018-04-12 IMAGING — CT CT ABD-PELV W/ CM
2 of 5 series · 7 of 46 positions shown, 8 images · IV contrast (iopamidol)
Comparison: Chest x-ray 08/27/2015

CLINICAL DATA: Pulmonary lesions noted on recent chest x-ray.
Evaluate for metastatic disease.

EXAM:
CT CHEST, ABDOMEN, AND PELVIS WITH CONTRAST
TECHNIQUE: Multidetector CT imaging of the chest, abdomen and pelvis was
performed following the standard protocol during bolus
administration of intravenous contrast.
CONTRAST:  100mL MXKBNG-0XX IOPAMIDOL (MXKBNG-[id number]) INJECTION 61%

[Series 201: cap with, idose (2) · axial · 0.71mm/px · z∈[+137,+572]mm · 4 of 127 slices shown, 5 images]
[im 20/127  soft-tissue]
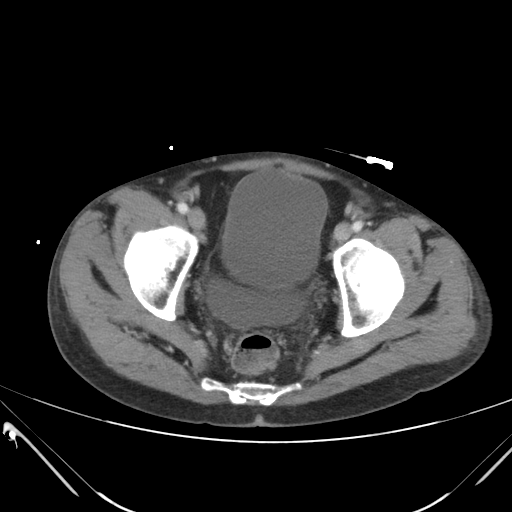
[im 20/127  bone]
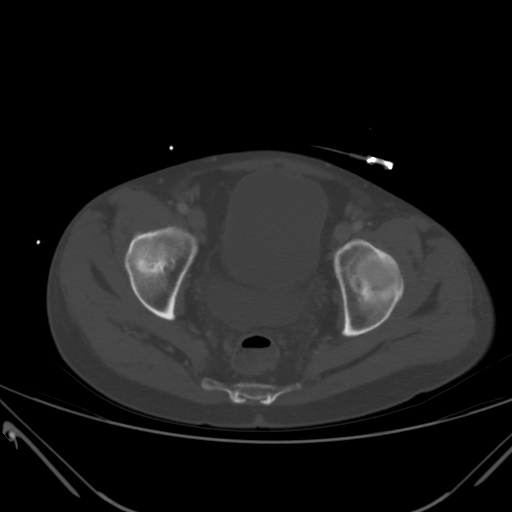
[im 49/127  soft-tissue]
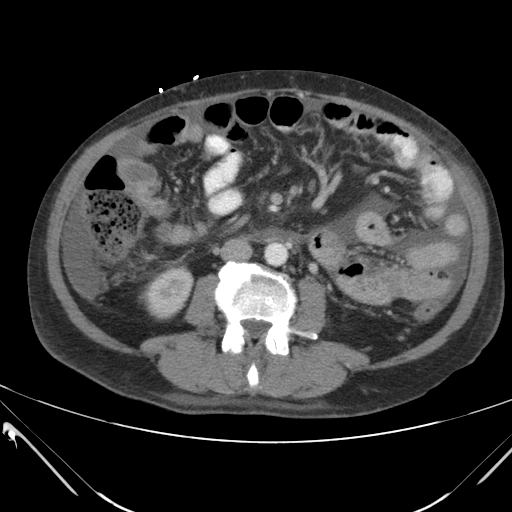
[im 78/127  soft-tissue]
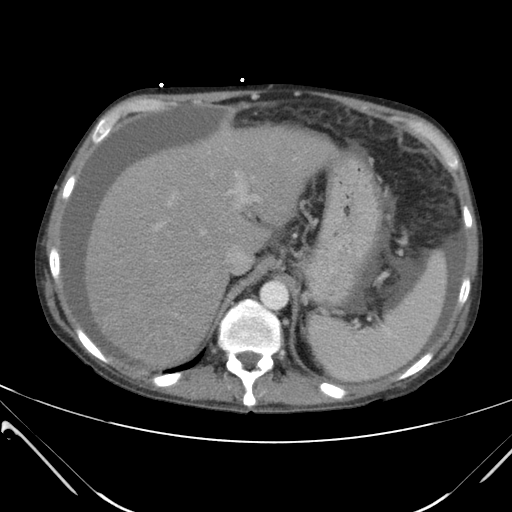
[im 107/127  soft-tissue]
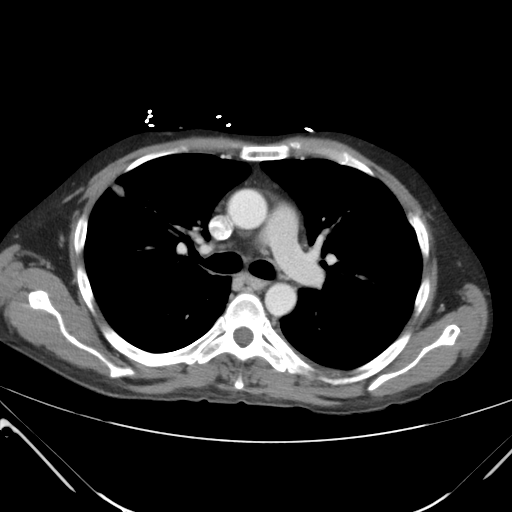

[Series 203: coronals, idose (2) · coronal · 0.45mm/px · 3 of 102 slices shown]
[im 34/102  soft-tissue]
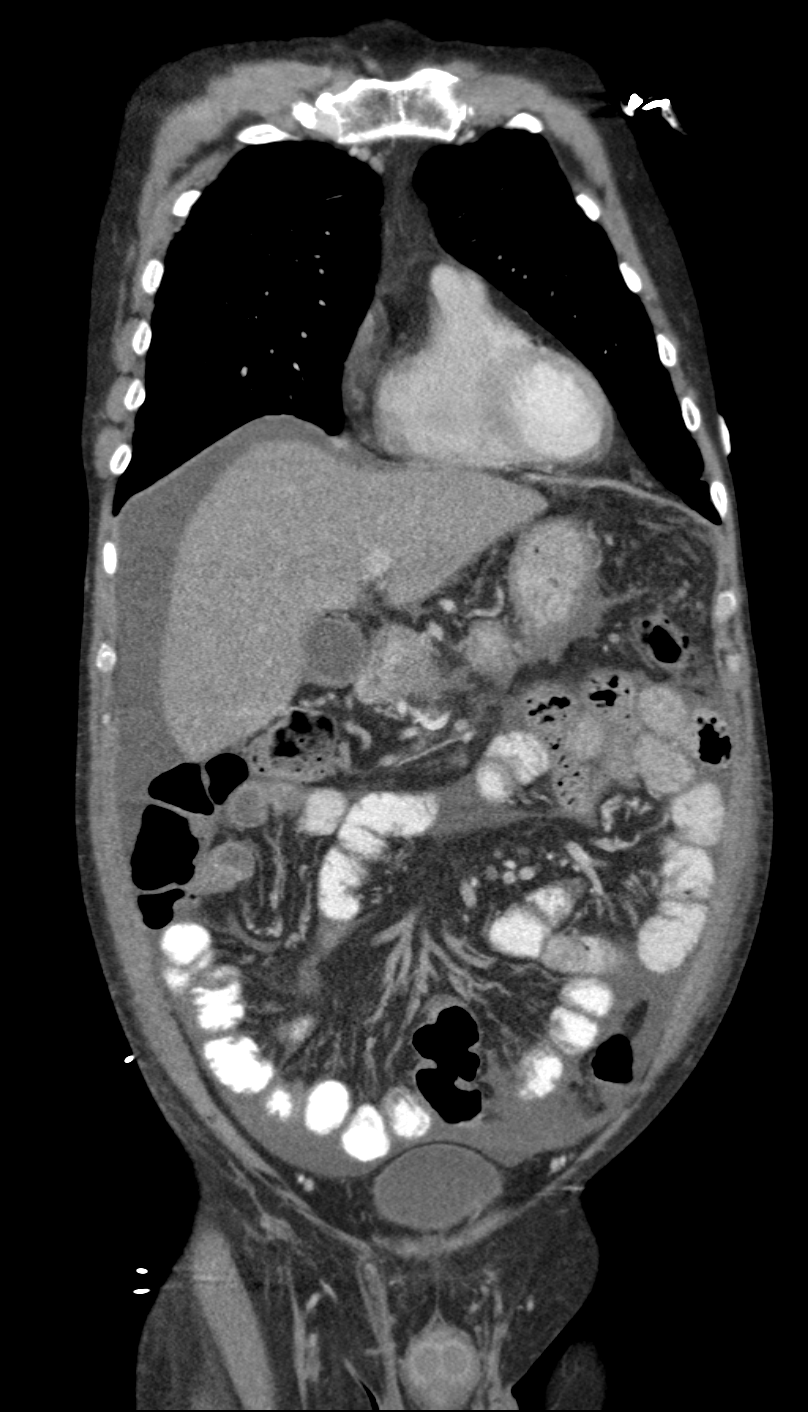
[im 45/102  soft-tissue]
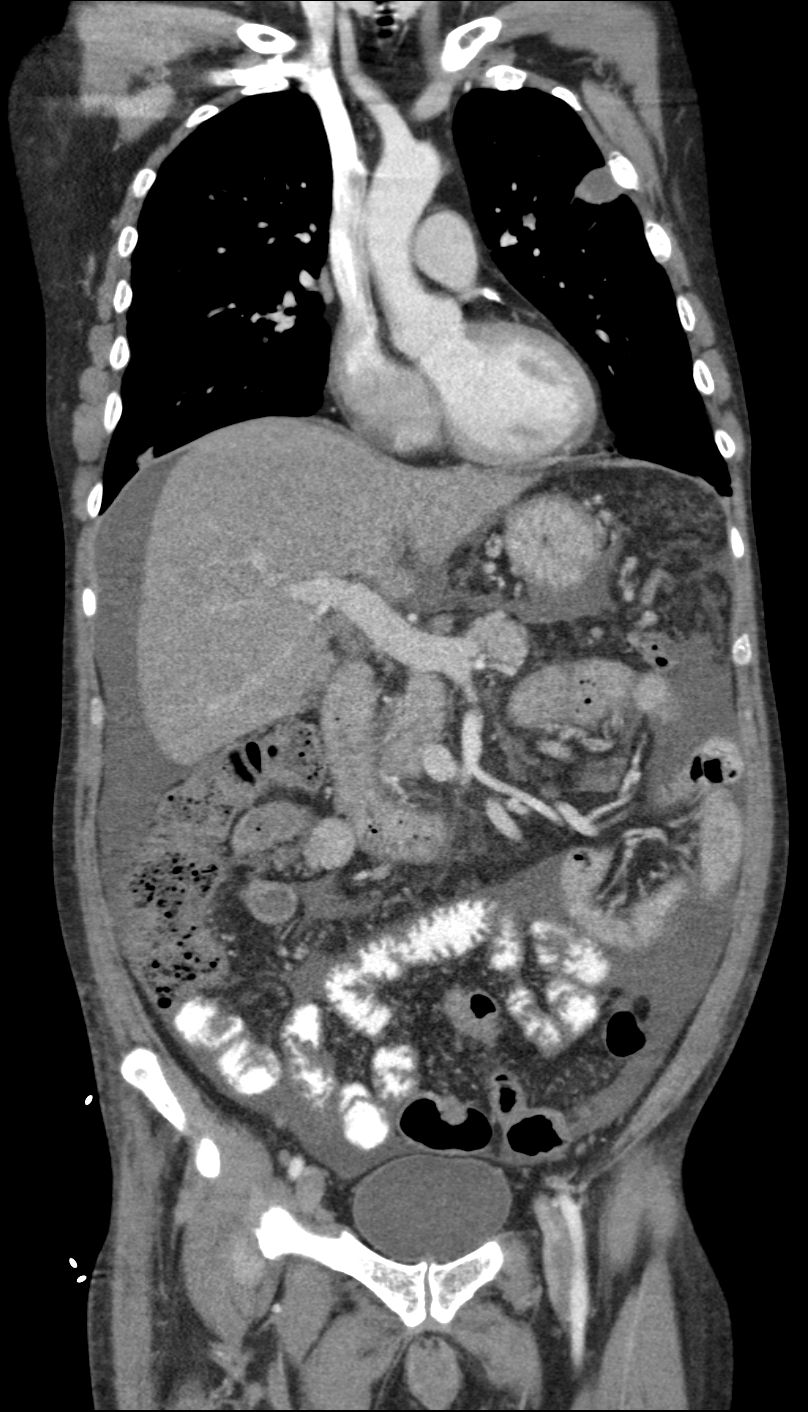
[im 57/102  soft-tissue]
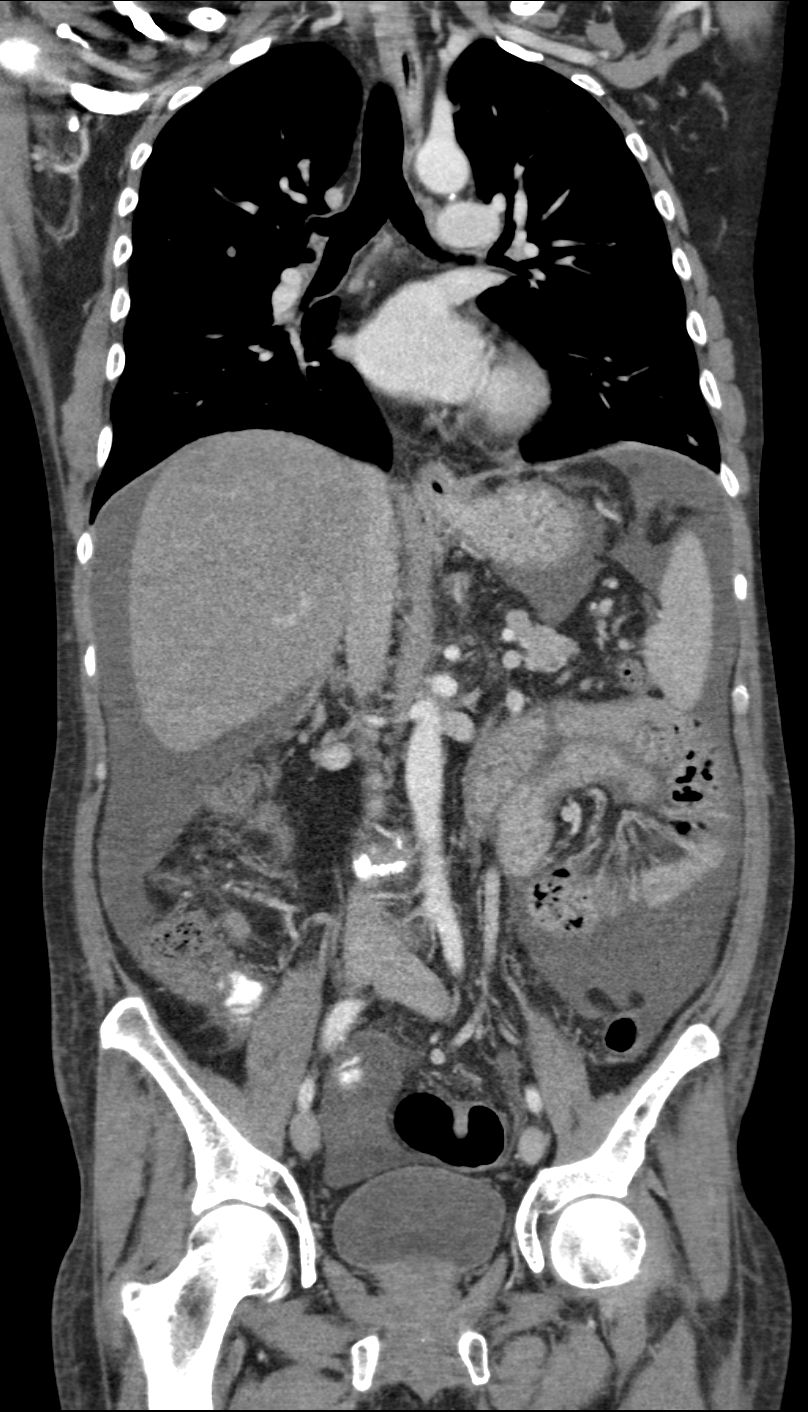

[7 of 46 positions shown; findings below may reference images not displayed]

FINDINGS: CT CHEST FINDINGS

Mediastinum/Lymph Nodes: The chest wall is unremarkable. No chest
wall mass, supraclavicular or axillary lymphadenopathy. Small
scattered lymph nodes are noted. The thyroid gland is not imaged.
The visualized isthmic portion appears normal.

The heart is normal in size. No pericardial effusion. The aorta is
normal in caliber. No atherosclerotic calcifications. The branch
vessels are patent. Coronary artery calcifications are noted.

The esophagus is grossly normal.

Borderline mediastinal lymph nodes. Scattered small prevascular
nodes. The largest measures 5 mm on image 18. Pretracheal lymph node
on image number 15 measures 8 mm. Subcarinal lymph node on image
number 23 measures 10 mm. No hilar adenopathy.

Lungs/Pleura: Extensive pulmonary metastatic disease with numerous
lesions of varying size. Some of these appears slightly cavitated.
No edema or infiltrates. There is a small left pleural effusion with
overlying atelectasis.

Musculoskeletal: No significant bony findings. No definite findings
for osseous metastatic disease.

CT ABDOMEN PELVIS FINDINGS

Hepatobiliary: Diffuse fatty infiltration of the liver along with
cirrhotic changes but no focal hepatic lesion to suggest hepatoma
and no evidence of metastatic disease. The portal and hepatic veins
are patent. The gallbladder is mildly distended. Small nodules are
suspected which could be polyps. No common bile duct dilatation.

Pancreas: No mass, inflammation or ductal dilatation.

Spleen: Normal size.  No focal lesions.

Adrenals/Urinary Tract: The adrenal glands and kidneys are
unremarkable.

Stomach/Bowel: The stomach, duodenum, small bowel and colon are
grossly normal. No inflammatory changes, mass lesions or obstructive
findings. The terminal ileum appears normal. The appendix is normal.

Vascular/Lymphatic: The aorta and branch vessels are patent. The
major venous structures are patent. There are small scattered
mesenteric and retroperitoneal mass or overt adenopathy.

Reproductive: The bladder, prostate gland and seminal vesicles are
unremarkable.

Other: There is portal venous hypertension with extensive portal
venous collaterals and esophageal varices. There is also moderate
volume abdominal/ pelvic ascites. I do not see an obvious omental
mass or peritoneal surface disease.

Musculoskeletal: No significant bony findings. I do not see any
obvious metastatic bone disease
IMPRESSION: 1. Numerous pulmonary nodules could suggest metastatic disease.
There is mild inflammation around nodules and some of them
demonstrate cavitation or air bronchograms. This could be metastatic
disease or septic emboli or atypical infection such as candidiasis.
Recommend pulmonary consultation. If this is metastatic disease I do
not see an obvious source.
2. Small left pleural effusion with overlying atelectasis.
3. Small scattered mediastinal and hilar lymph nodes.
4. Cirrhosis with portal venous hypertension, portal venous
collaterals, esophageal varices and ascites.
5. No hepatic lesions to suggest HCC or metastasis.
6. No definite findings for osseous metastatic disease.

## 2018-05-02 IMAGING — US US ABDOMEN LIMITED
1 series · 7 of 7 positions shown · non-contrast
Comparison: Paracentesis images performed 10/04/2015

CLINICAL DATA: Assess ascites. Acute onset of abdominal distention,
with current history of cirrhosis. Initial encounter.

EXAM:
LIMITED ABDOMEN ULTRASOUND FOR ASCITES
TECHNIQUE: Limited ultrasound survey for ascites was performed in all four
abdominal quadrants.

[Series 1: us abdomen limited · 0.30mm/px · 7 of 7 slices shown]
[im 1/7]
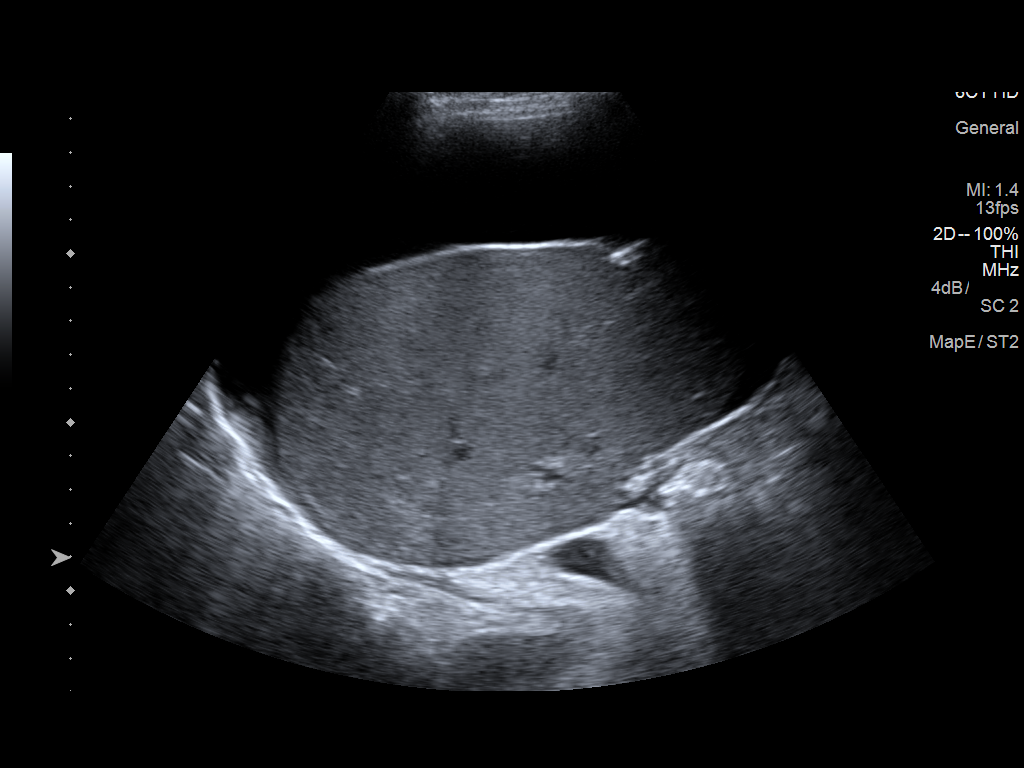
[im 2/7]
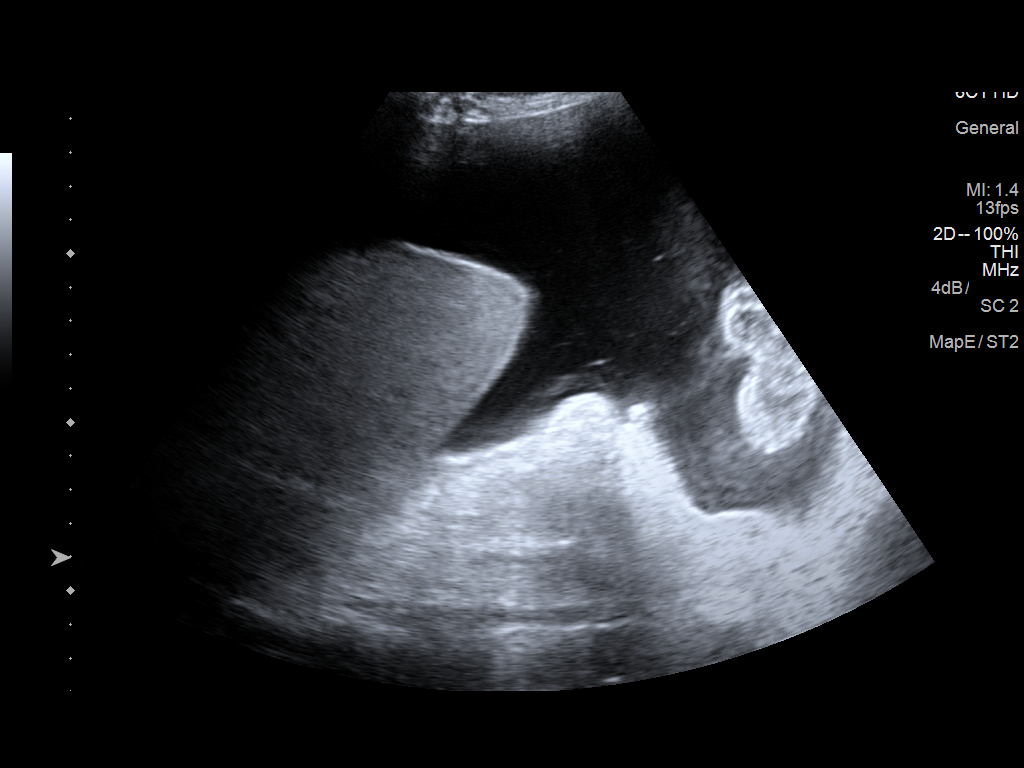
[im 3/7]
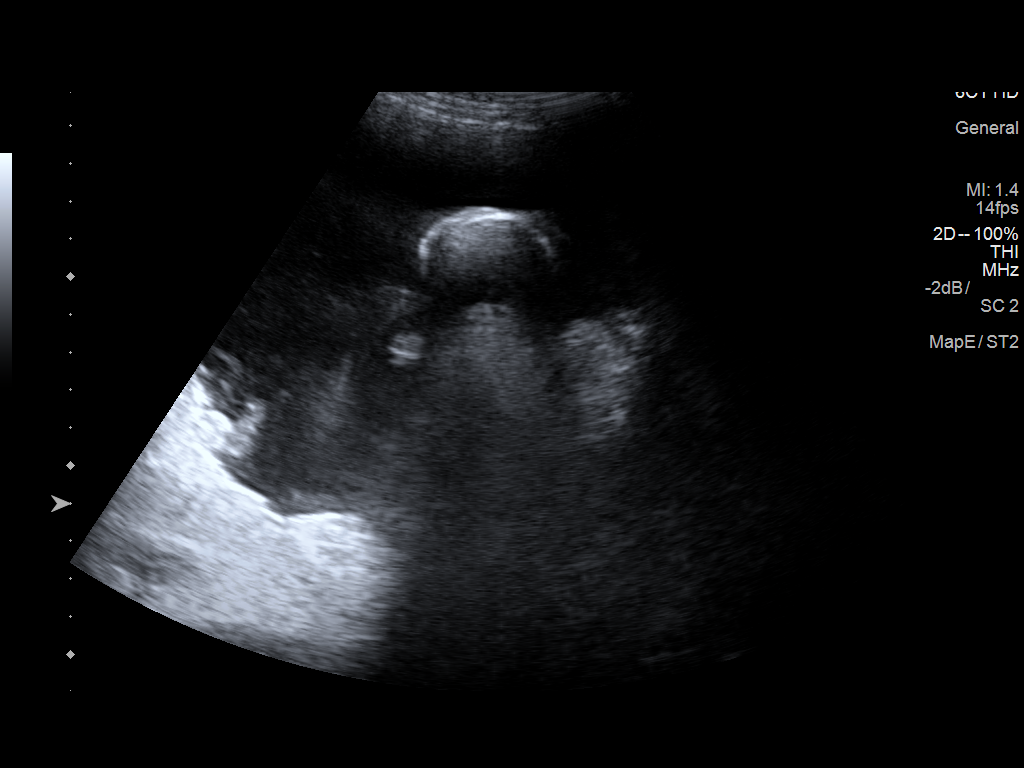
[im 4/7]
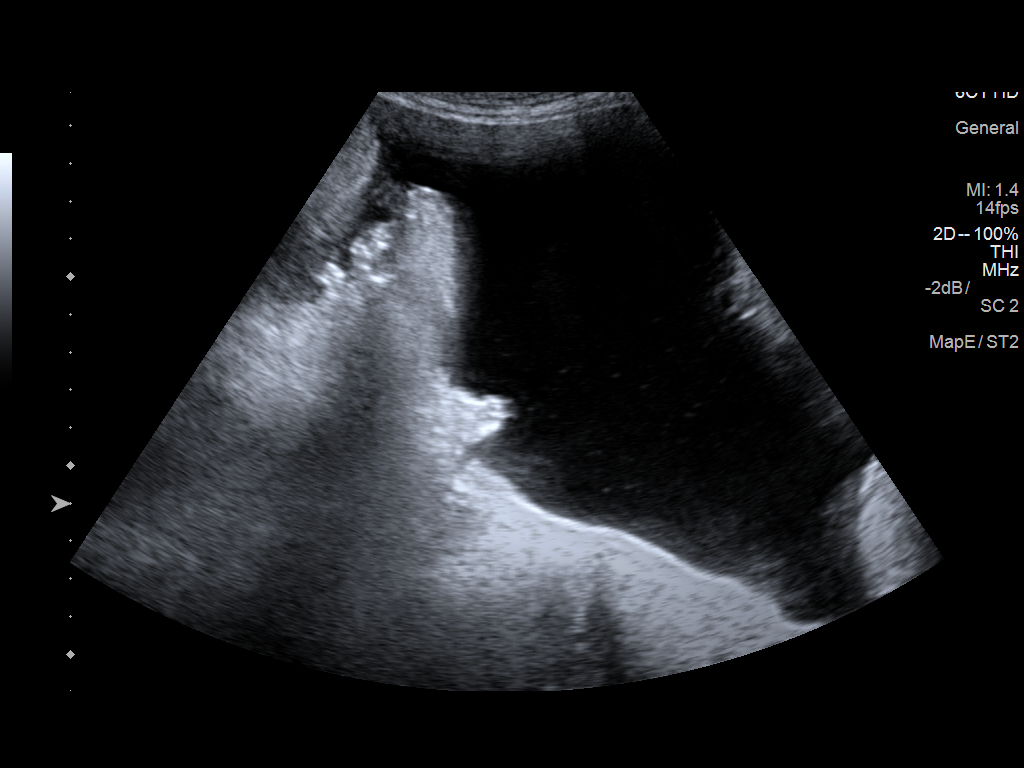
[im 5/7]
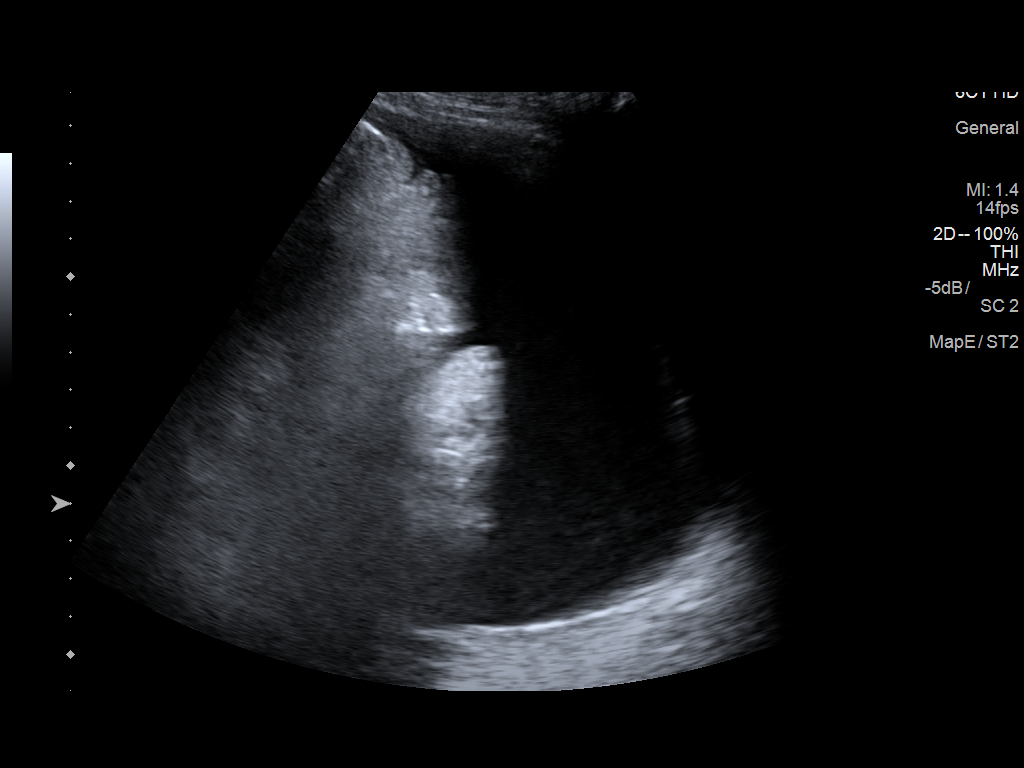
[im 6/7]
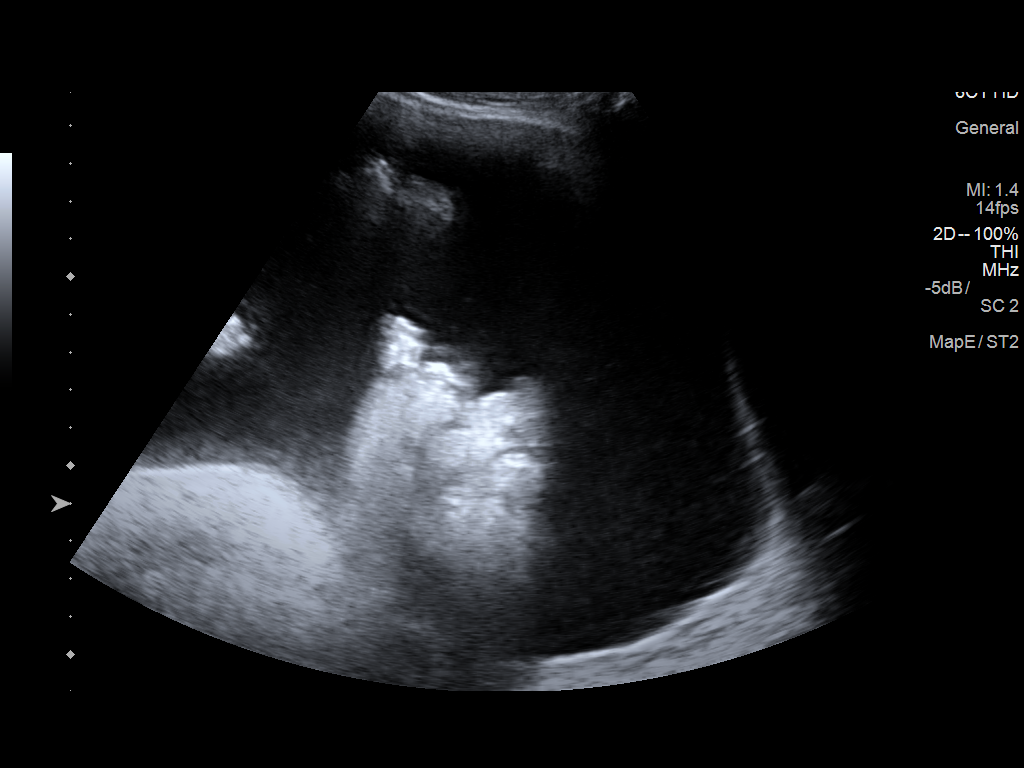
[im 7/7]
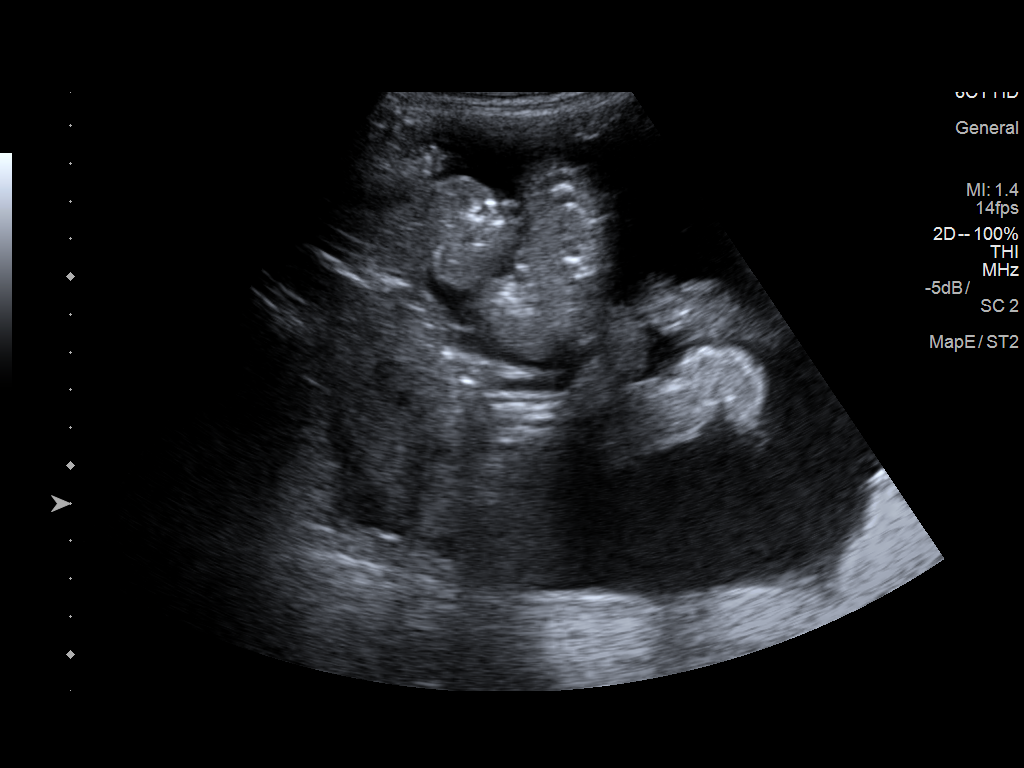

[7 of 7 positions shown; findings below may reference images not displayed]

FINDINGS: Moderate volume ascites is noted within all 4 quadrants of the
abdomen.
IMPRESSION: Moderate volume ascites noted within all 4 quadrants of the abdomen.
